# Patient Record
Sex: Female | Born: 1970 | Hispanic: No | Marital: Married | Smoking: Never smoker
Health system: Southern US, Community
[De-identification: ages and names within clinical notes are randomized; demographics above are authoritative.]

## PROBLEM LIST (undated history)

## (undated) DIAGNOSIS — E7800 Pure hypercholesterolemia, unspecified: Secondary | ICD-10-CM

## (undated) DIAGNOSIS — K219 Gastro-esophageal reflux disease without esophagitis: Secondary | ICD-10-CM

## (undated) DIAGNOSIS — E079 Disorder of thyroid, unspecified: Secondary | ICD-10-CM

## (undated) HISTORY — DX: Gastro-esophageal reflux disease without esophagitis: K21.9

## (undated) HISTORY — PX: TONSILLECTOMY AND ADENOIDECTOMY: SUR1326

## (undated) HISTORY — PX: SINUS IRRIGATION: SHX2411

## (undated) HISTORY — DX: Disorder of thyroid, unspecified: E07.9

## (undated) HISTORY — DX: Pure hypercholesterolemia, unspecified: E78.00

## (undated) HISTORY — PX: COCHLEAR IMPLANT: SUR684

---

## 2009-05-23 ENCOUNTER — Ambulatory Visit: Payer: Self-pay | Admitting: Family Medicine

## 2010-02-24 ENCOUNTER — Emergency Department: Payer: Self-pay | Admitting: Emergency Medicine

## 2012-06-04 ENCOUNTER — Emergency Department: Payer: Self-pay | Admitting: *Deleted

## 2012-06-04 LAB — CBC WITH DIFFERENTIAL/PLATELET
Basophil #: 0.1 10*3/uL (ref 0.0–0.1)
Eosinophil #: 0.3 10*3/uL (ref 0.0–0.7)
Eosinophil %: 2.5 %
HGB: 14.4 g/dL (ref 12.0–16.0)
MCH: 31.2 pg (ref 26.0–34.0)
MCV: 89 fL (ref 80–100)
Monocyte #: 0.8 x10 3/mm (ref 0.2–0.9)
Monocyte %: 7.2 %
Neutrophil #: 4.8 10*3/uL (ref 1.4–6.5)
Neutrophil %: 46 %
RBC: 4.64 10*6/uL (ref 3.80–5.20)
WBC: 10.4 10*3/uL (ref 3.6–11.0)

## 2012-06-04 LAB — URINALYSIS, COMPLETE
Bilirubin,UR: NEGATIVE
Blood: NEGATIVE
Hyaline Cast: 1
Leukocyte Esterase: NEGATIVE
Ph: 6 (ref 4.5–8.0)
Protein: NEGATIVE
RBC,UR: <1 /HPF (ref 0–5)

## 2012-06-04 LAB — BASIC METABOLIC PANEL
Chloride: 98 mmol/L (ref 98–107)
Co2: 34 mmol/L — ABNORMAL HIGH (ref 21–32)
Creatinine: 0.88 mg/dL (ref 0.60–1.30)
EGFR (African American): >60
Osmolality: 277 (ref 275–301)
Sodium: 139 mmol/L (ref 136–145)

## 2012-07-15 ENCOUNTER — Emergency Department: Payer: Self-pay | Admitting: Emergency Medicine

## 2015-10-03 ENCOUNTER — Ambulatory Visit: Payer: Self-pay | Admitting: Internal Medicine

## 2015-10-03 DIAGNOSIS — K219 Gastro-esophageal reflux disease without esophagitis: Secondary | ICD-10-CM | POA: Insufficient documentation

## 2015-10-03 DIAGNOSIS — I10 Essential (primary) hypertension: Secondary | ICD-10-CM | POA: Insufficient documentation

## 2015-10-03 DIAGNOSIS — E785 Hyperlipidemia, unspecified: Secondary | ICD-10-CM | POA: Insufficient documentation

## 2015-10-03 DIAGNOSIS — E039 Hypothyroidism, unspecified: Secondary | ICD-10-CM | POA: Insufficient documentation

## 2015-10-03 DIAGNOSIS — G969 Disorder of central nervous system, unspecified: Secondary | ICD-10-CM | POA: Insufficient documentation

## 2015-12-05 ENCOUNTER — Ambulatory Visit: Payer: Self-pay

## 2015-12-12 ENCOUNTER — Ambulatory Visit: Payer: Self-pay | Admitting: Internal Medicine

## 2016-01-15 DIAGNOSIS — G969 Disorder of central nervous system, unspecified: Secondary | ICD-10-CM

## 2016-01-15 DIAGNOSIS — I10 Essential (primary) hypertension: Secondary | ICD-10-CM

## 2016-01-15 DIAGNOSIS — E785 Hyperlipidemia, unspecified: Secondary | ICD-10-CM

## 2016-01-15 DIAGNOSIS — E039 Hypothyroidism, unspecified: Secondary | ICD-10-CM

## 2016-01-15 DIAGNOSIS — K219 Gastro-esophageal reflux disease without esophagitis: Secondary | ICD-10-CM

## 2016-01-16 ENCOUNTER — Ambulatory Visit: Payer: Self-pay | Admitting: Ophthalmology

## 2016-02-06 ENCOUNTER — Ambulatory Visit: Payer: Self-pay | Admitting: Internal Medicine

## 2016-02-06 ENCOUNTER — Encounter: Payer: Self-pay | Admitting: Internal Medicine

## 2016-02-06 VITALS — BP 116/80 | HR 81 | Temp 99.1°F | Wt 218.0 lb

## 2016-02-06 DIAGNOSIS — M25519 Pain in unspecified shoulder: Secondary | ICD-10-CM

## 2016-02-06 DIAGNOSIS — I10 Essential (primary) hypertension: Secondary | ICD-10-CM

## 2016-02-06 DIAGNOSIS — E039 Hypothyroidism, unspecified: Secondary | ICD-10-CM

## 2016-02-06 NOTE — Progress Notes (Signed)
   Subjective:    Patient ID: Jamie Whitaker, female    DOB: 11/10/1971, 45 y.o.   MRN: [id number]  HPI  Patient Active Problem List   Diagnosis Date Noted  . GERD (gastroesophageal reflux disease) 10/03/2015  . Hyperlipidemia 10/03/2015  . Hypothyroidism 10/03/2015  . Hypertension 10/03/2015  . 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 Pt complains of shoulder pain. Pt was seen at [facility] but they said it was Chronic pain.    Review of Systems  Musculoskeletal: Positive for joint swelling.       Objective:   Physical Exam  Constitutional: She is oriented to person, place, and time.  Cardiovascular: Normal rate, regular rhythm and normal heart sounds.   Pulmonary/Chest: Effort normal and breath sounds normal.  Neurological: She is alert and oriented to person, place, and time.    BP 116/80 mmHg  Pulse 81  Temp(Src) 99.1 F (37.3 C)  Wt 218 lb (98.884 kg)    Medication List       This list is accurate as of: 02/06/16 10:35 AM.  Always use your most recent med list.               atorvastatin 20 MG tablet  Commonly known as:  LIPITOR  Take 20 mg by mouth daily.     citalopram 20 MG tablet  Commonly known as:  CELEXA  Take 20 mg by mouth daily.     gabapentin 300 MG capsule  Commonly known as:  NEURONTIN  Take 300 mg by mouth 3 (three) times daily.     levothyroxine 25 MCG tablet  Commonly known as:  SYNTHROID, LEVOTHROID  Take 25 mcg by mouth daily before breakfast.     loratadine 10 MG tablet  Commonly known as:  CLARITIN  Take 10 mg by mouth daily.     naproxen 500 MG tablet  Commonly known as:  NAPROSYN  Take 500 mg by mouth 2 (two) times daily with a meal.     omeprazole 20 MG capsule  Commonly known as:  PRILOSEC  Take 20 mg by mouth daily.     triamterene-hydrochlorothiazide 75-50 MG tablet  Commonly known as:  MAXZIDE  Take 1 tablet by mouth daily.            Assessment & Plan:   Shoulder pain Pt is on Naproxen. Does not appear to be an inflammatory form of arthritis but will check labs.   Hypertension Well-controlled.    Labs today, Met c, cbc, lipid, tsa, vitamin d, UA Md f/u: 6 months Labs the week before.

## 2016-02-06 NOTE — Assessment & Plan Note (Signed)
Well controlled 

## 2016-02-06 NOTE — Assessment & Plan Note (Addendum)
Pt is on Naproxen. Does not appear to be an inflammatory form of arthritis but will check labs. Pt advised to not overstrain the shoulder and rest it.

## 2016-02-07 LAB — COMPREHENSIVE METABOLIC PANEL
A/G RATIO: 1.7 (ref 1.2–2.2)
ALBUMIN: 4.3 g/dL (ref 3.5–5.5)
ALT: 16 IU/L (ref 0–32)
AST: 39 IU/L (ref 0–40)
Alkaline Phosphatase: 71 IU/L (ref 39–117)
BUN / CREAT RATIO: 18 (ref 9–23)
BUN: 14 mg/dL (ref 6–24)
Bilirubin Total: 0.3 mg/dL (ref 0.0–1.2)
CALCIUM: 9 mg/dL (ref 8.7–10.2)
CO2: 26 mmol/L (ref 18–29)
CREATININE: 0.78 mg/dL (ref 0.57–1.00)
Chloride: 102 mmol/L (ref 96–106)
GFR, EST AFRICAN AMERICAN: 108 mL/min/{1.73_m2} (ref >59)
GFR, EST NON AFRICAN AMERICAN: 94 mL/min/{1.73_m2} (ref >59)
GLOBULIN, TOTAL: 2.6 g/dL (ref 1.5–4.5)
Glucose: 102 mg/dL — ABNORMAL HIGH (ref 65–99)
POTASSIUM: 3.4 mmol/L — AB (ref 3.5–5.2)
SODIUM: 142 mmol/L (ref 134–144)
TOTAL PROTEIN: 6.9 g/dL (ref 6.0–8.5)

## 2016-02-07 LAB — CBC WITH DIFFERENTIAL/PLATELET
BASOS: 0 %
Basophils Absolute: 0 10*3/uL (ref 0.0–0.2)
EOS (ABSOLUTE): 0.3 10*3/uL (ref 0.0–0.4)
EOS: 4 %
HEMATOCRIT: 38.3 % (ref 34.0–46.6)
Hemoglobin: 13.1 g/dL (ref 11.1–15.9)
IMMATURE GRANULOCYTES: 0 %
Immature Grans (Abs): 0 10*3/uL (ref 0.0–0.1)
Lymphocytes Absolute: 2.5 10*3/uL (ref 0.7–3.1)
Lymphs: 34 %
MCH: 29.8 pg (ref 26.6–33.0)
MCHC: 34.2 g/dL (ref 31.5–35.7)
MCV: 87 fL (ref 79–97)
MONOS ABS: 0.5 10*3/uL (ref 0.1–0.9)
Monocytes: 6 %
NEUTROS ABS: 4.1 10*3/uL (ref 1.4–7.0)
NEUTROS PCT: 56 %
PLATELETS: 408 10*3/uL — AB (ref 150–379)
RBC: 4.4 x10E6/uL (ref 3.77–5.28)
RDW: 14.1 % (ref 12.3–15.4)
WBC: 7.4 10*3/uL (ref 3.4–10.8)

## 2016-02-07 LAB — URINALYSIS
BILIRUBIN UA: NEGATIVE
GLUCOSE, UA: NEGATIVE
KETONES UA: NEGATIVE
Leukocytes, UA: NEGATIVE
Nitrite, UA: NEGATIVE
PROTEIN UA: NEGATIVE
RBC, UA: NEGATIVE
Specific Gravity, UA: 1.024 (ref 1.005–1.030)
Urobilinogen, Ur: 0.2 mg/dL (ref 0.2–1.0)
pH, UA: 6 (ref 5.0–7.5)

## 2016-02-07 LAB — TSH: TSH: 2.68 u[IU]/mL (ref 0.450–4.500)

## 2016-02-07 LAB — LIPID PANEL
CHOL/HDL RATIO: 4.2 ratio (ref 0.0–4.4)
Cholesterol, Total: 157 mg/dL (ref 100–199)
HDL: 37 mg/dL — AB (ref >39)
LDL Calculated: 102 mg/dL — ABNORMAL HIGH (ref 0–99)
Triglycerides: 90 mg/dL (ref 0–149)
VLDL CHOLESTEROL CAL: 18 mg/dL (ref 5–40)

## 2016-02-07 LAB — VITAMIN D 25 HYDROXY (VIT D DEFICIENCY, FRACTURES): VIT D 25 HYDROXY: 30.2 ng/mL (ref 30.0–100.0)

## 2016-02-13 NOTE — Progress Notes (Signed)
Labs OK let Pt Know   No Rx changes needed

## 2016-02-19 ENCOUNTER — Telehealth: Payer: Self-pay

## 2016-02-19 NOTE — Telephone Encounter (Signed)
Pt called wants a voucher for eye glasses  [phone]

## 2016-02-21 NOTE — Telephone Encounter (Signed)
Pt left message and is waiting for another return call.

## 2016-02-21 NOTE — Telephone Encounter (Signed)
Returned pt's call lm [phone]

## 2016-08-06 ENCOUNTER — Other Ambulatory Visit: Payer: Self-pay

## 2016-08-06 DIAGNOSIS — E785 Hyperlipidemia, unspecified: Secondary | ICD-10-CM

## 2016-08-08 LAB — LIPID PANEL
Chol/HDL Ratio: 3.8 ratio units (ref 0.0–4.4)
Cholesterol, Total: 172 mg/dL (ref 100–199)
HDL: 45 mg/dL (ref >39)
LDL Calculated: 105 mg/dL — ABNORMAL HIGH (ref 0–99)
Triglycerides: 110 mg/dL (ref 0–149)
VLDL CHOLESTEROL CAL: 22 mg/dL (ref 5–40)

## 2016-08-08 LAB — CBC WITH DIFFERENTIAL

## 2016-08-08 LAB — URINALYSIS

## 2016-08-08 LAB — TSH: TSH: 2.23 u[IU]/mL (ref 0.450–4.500)

## 2016-08-08 LAB — VITAMIN D 25 HYDROXY (VIT D DEFICIENCY, FRACTURES): Vit D, 25-Hydroxy: 29.4 ng/mL — ABNORMAL LOW (ref 30.0–100.0)

## 2016-08-13 ENCOUNTER — Ambulatory Visit: Payer: Self-pay | Admitting: Internal Medicine

## 2016-08-14 ENCOUNTER — Ambulatory Visit: Payer: Self-pay | Admitting: Urology

## 2016-08-14 VITALS — BP 137/90 | HR 93 | Ht 61.0 in | Wt 226.0 lb

## 2016-08-14 DIAGNOSIS — R7989 Other specified abnormal findings of blood chemistry: Secondary | ICD-10-CM

## 2016-08-14 DIAGNOSIS — J00 Acute nasopharyngitis [common cold]: Secondary | ICD-10-CM

## 2016-08-14 DIAGNOSIS — K219 Gastro-esophageal reflux disease without esophagitis: Secondary | ICD-10-CM

## 2016-08-14 MED ORDER — AMOXICILLIN-POT CLAVULANATE 875-125 MG PO TABS
1.0000 | ORAL_TABLET | Freq: Two times a day (BID) | ORAL | 0 refills | Status: DC
Start: 2016-08-14 — End: 2019-11-07

## 2016-08-14 NOTE — Progress Notes (Signed)
  Patient: Jamie LoftyDanielle Q Leccese Female    DOB: [birth year]   45 y.o.   MRN: [id number] Visit Date: 08/14/2016  Today's Provider: [facility]   Chief Complaint  Patient presents with  . Follow-up    labwork  . URI    1 wk hx of coughing up yellow sputum, and sinus congestion, with chest pain (pain is with breathing, worse with coughing)    Subjective:    HPI Patient is a 45 year old AAF who presents for labs results and symptoms of an URI for 1 week.    Vitamin D level is 29.    Obesity- patient is eating fried foods and does not want to change her eating habits  Congestion is really bad in the morning  Patient has is antagonist at today's visit.  She is wondering if I can make the right diagnosis.    She has been using OTC for the last week.  Already taking allergy medications.  Wanting an antibiotic.    She feels she needs to stop going to doctors because they tell her the same thing.   No Known Allergies Previous Medications   ATORVASTATIN (LIPITOR) 20 MG TABLET    Take 20 mg by mouth daily.   CITALOPRAM (CELEXA) 20 MG TABLET    Take 20 mg by mouth daily.   FEXOFENADINE (ALLEGRA) 180 MG TABLET    Take 180 mg by mouth daily.   GABAPENTIN (NEURONTIN) 300 MG CAPSULE    Take 300 mg by mouth 3 (three) times daily.   LEVOTHYROXINE (SYNTHROID, LEVOTHROID) 25 MCG TABLET    Take 25 mcg by mouth daily before breakfast.   LORATADINE (CLARITIN) 10 MG TABLET    Take 10 mg by mouth daily.   NAPROXEN (NAPROSYN) 500 MG TABLET    Take 500 mg by mouth 2 (two) times daily with a meal.   OMEPRAZOLE (PRILOSEC) 20 MG CAPSULE    Take 20 mg by mouth daily.   TRIAMTERENE-HYDROCHLOROTHIAZIDE (MAXZIDE) 75-50 MG TABLET    Take 1 tablet by mouth daily.    Review of Systems  Social History  Substance Use Topics  . Smoking status: Never Smoker  . Smokeless tobacco: Never Used  . Alcohol use Yes     Comment: Moonshine 3 shots per day   Objective:   BP 137/90   Pulse 93   Ht 5\' 1"  (1.549  m)   Wt 226 lb (102.5 kg)   LMP  (Approximate) Comment: Had 2 during Sept 2017  BMI 42.70 kg/m   Physical Exam Constitutional: Well nourished. Alert and oriented, No acute distress. HEENT: [place] AT, moist mucus membranes. Trachea midline, no masses. Cardiovascular: No clubbing, cyanosis, or edema. Respiratory: Normal respiratory effort, no increased work of breathing. Skin: No rashes, bruises or suspicious lesions. Lymph: No cervical or inguinal adenopathy. Neurologic: Grossly intact, no focal deficits, moving all 4 extremities. Psychiatric: Normal mood and affect.      Assessment & Plan:  1. URI  - start Augmentin 875/125, one twice daily for ten days   2. GERD  - advised to lose weight  3. Low vitamin D  - start Vitamin D3 5000 IU  [facility]   [facility]

## 2016-12-25 ENCOUNTER — Ambulatory Visit: Payer: Self-pay

## 2017-08-04 ENCOUNTER — Telehealth: Payer: Self-pay | Admitting: Nurse Practitioner

## 2017-08-04 NOTE — Telephone Encounter (Signed)
Wants to make an eye appt. Also asked about flu shots

## 2017-08-11 ENCOUNTER — Telehealth: Payer: Self-pay | Admitting: Adult Health Nurse Practitioner

## 2017-08-11 NOTE — Telephone Encounter (Signed)
Wanted to know date of free flu shot. Called back

## 2019-11-07 ENCOUNTER — Ambulatory Visit
Admission: EM | Admit: 2019-11-07 | Discharge: 2019-11-07 | Disposition: A | Discharge status: Home / Self Care | Payer: HRSA Program | Attending: Internal Medicine | Admitting: Internal Medicine

## 2019-11-07 ENCOUNTER — Other Ambulatory Visit: Payer: Self-pay

## 2019-11-07 DIAGNOSIS — Z79890 Hormone replacement therapy (postmenopausal): Secondary | ICD-10-CM | POA: Diagnosis not present

## 2019-11-07 DIAGNOSIS — Z9621 Cochlear implant status: Secondary | ICD-10-CM | POA: Insufficient documentation

## 2019-11-07 DIAGNOSIS — J0100 Acute maxillary sinusitis, unspecified: Secondary | ICD-10-CM | POA: Diagnosis not present

## 2019-11-07 DIAGNOSIS — Z79899 Other long term (current) drug therapy: Secondary | ICD-10-CM | POA: Diagnosis not present

## 2019-11-07 DIAGNOSIS — Z20828 Contact with and (suspected) exposure to other viral communicable diseases: Secondary | ICD-10-CM | POA: Insufficient documentation

## 2019-11-07 DIAGNOSIS — Z841 Family history of disorders of kidney and ureter: Secondary | ICD-10-CM | POA: Diagnosis not present

## 2019-11-07 DIAGNOSIS — E785 Hyperlipidemia, unspecified: Secondary | ICD-10-CM | POA: Diagnosis not present

## 2019-11-07 DIAGNOSIS — E039 Hypothyroidism, unspecified: Secondary | ICD-10-CM | POA: Diagnosis not present

## 2019-11-07 DIAGNOSIS — K219 Gastro-esophageal reflux disease without esophagitis: Secondary | ICD-10-CM | POA: Insufficient documentation

## 2019-11-07 DIAGNOSIS — I10 Essential (primary) hypertension: Secondary | ICD-10-CM | POA: Insufficient documentation

## 2019-11-07 MED ORDER — AMOXICILLIN 500 MG PO CAPS: 500.0000 mg | ORAL_CAPSULE | Freq: Three times a day (TID) | ORAL | 0 refills | Status: DC

## 2019-11-07 MED ORDER — FLUTICASONE PROPIONATE 50 MCG/ACT NA SUSP: 1.0000 | Freq: Every day | NASAL | 0 refills | Status: AC

## 2019-11-07 NOTE — ED Provider Notes (Signed)
[facility]    CSN: [id number] Arrival date & time: 11/07/19  1634      History   Chief Complaint Chief Complaint  Patient presents with  . Sinusitis    HPI Jamie Whitaker is a 48 y.o. female with a history of hypercholesterolemia comes to urgent care with complaints of nasal congestion of 1 week duration.  Symptoms started insidiously and is gotten progressively worse.  It is associated with clear nasal discharge.   Patient also complains of pressure over the sinuses.  No fever or chills.  Patient denies postnasal drip no nausea or vomiting.  No shortness of breath.  No chest pain or chest pressure.  HPI  Past Medical History:  Diagnosis Date  . GERD (gastroesophageal reflux disease)   . High cholesterol   . Thyroid disease     Patient Active Problem List   Diagnosis Date Noted  . Shoulder pain 02/06/2016  . GERD (gastroesophageal reflux disease) 10/03/2015  . Hyperlipidemia 10/03/2015  . Hypothyroidism 10/03/2015  . Hypertension 10/03/2015  . 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 COCHLEAR IMPLANT      OB History   No obstetric history on file.      Home Medications    Prior to Admission medications   Medication Sig Start Date End Date Taking? Authorizing Provider  atorvastatin (LIPITOR) 20 MG tablet Take 20 mg by mouth daily.   Yes [provider]  fexofenadine (ALLEGRA) 180 MG tablet Take 180 mg by mouth daily.   Yes [provider]  gabapentin (NEURONTIN) 300 MG capsule Take 300 mg by mouth 3 (three) times daily.   Yes [provider]  levothyroxine (SYNTHROID, LEVOTHROID) 25 MCG tablet Take 25 mcg by mouth daily before breakfast.   Yes [provider]  naproxen (NAPROSYN) 500 MG tablet Take 500 mg by mouth 2 (two) times daily with a meal.   Yes [provider]  omeprazole (PRILOSEC) 20 MG capsule Take 20 mg by mouth daily.   Yes  [provider]  triamterene-hydrochlorothiazide (MAXZIDE) 75-50 MG tablet Take 1 tablet by mouth daily.   Yes [provider]  Vitamin D, Ergocalciferol, (DRISDOL) 1.25 MG (50000 UT) CAPS capsule Take 50,000 Units by mouth once a week. 10/11/19  Yes [provider]  citalopram (CELEXA) 20 MG tablet Take 20 mg by mouth daily.  11/07/19 Yes [provider]  amoxicillin (AMOXIL) 500 MG capsule Take 1 capsule (500 mg total) by mouth 3 (three) times daily. 11/07/19   Chase Picket, MD  fluticasone (FLONASE) 50 MCG/ACT nasal spray Place 1 spray into both nostrils daily. 11/07/19   Chase Picket, MD  loratadine (CLARITIN) 10 MG tablet Take 10 mg by mouth daily.  11/07/19  [provider]    Family History Family History  Problem Relation Age of Onset  . Renal Disease Mother     Social History Social History   Tobacco Use  . Smoking status: Never Smoker  . Smokeless tobacco: Never Used  Substance Use Topics  . Alcohol use: Yes    Comment: Moonshine 3 shots per day  . Drug use: Yes    Types: Marijuana    Comment: 2 blunts per month      Allergies   Patient has no known allergies.   Review of Systems Review of Systems  Constitutional: Negative for activity change, chills, fatigue and fever.  HENT: Positive for sinus pressure  and sinus pain. Negative for congestion, facial swelling and sore throat.   Respiratory: Negative for cough, shortness of breath and wheezing.   Gastrointestinal: Negative for nausea and vomiting.  Musculoskeletal: Negative.   Skin: Negative.   Neurological: Negative for dizziness, weakness and headaches.     Physical Exam Triage Vital Signs ED Triage Vitals  Enc Vitals Group     BP 11/07/19 1705 (!) 149/94     Pulse Rate 11/07/19 1705 93     Resp 11/07/19 1705 14     Temp 11/07/19 1705 99.8 F (37.7 C)     Temp Source 11/07/19 1705 Oral     SpO2 11/07/19 1705 100 %     Weight 11/07/19 1701 235 lb  (106.6 kg)     Height 11/07/19 1701 5\' 1"  (1.549 m)     Head Circumference --      Peak Flow --      Pain Score 11/07/19 1701 8     Pain Loc --      Pain Edu? --      Excl. in GC? --    No data found.  Updated Vital Signs BP (!) 149/94 (BP Location: Left Arm)   Pulse 93   Temp 99.8 F (37.7 C) (Oral)   Resp 14   Ht 5\' 1"  (1.549 m)   Wt 106.6 kg   LMP 10/14/2019   SpO2 100%   BMI 44.40 kg/m   Visual Acuity Right Eye Distance:   Left Eye Distance:   Bilateral Distance:    Right Eye Near:   Left Eye Near:    Bilateral Near:     Physical Exam Vitals and nursing note reviewed.  Constitutional:      General: She is not in acute distress.    Appearance: Normal appearance. She is not ill-appearing.  HENT:     Right Ear: Tympanic membrane normal. There is no impacted cerumen.     Left Ear: Tympanic membrane normal. There is no impacted cerumen.     Nose: Congestion present.     Mouth/Throat:     Mouth: Mucous membranes are moist.     Pharynx: No oropharyngeal exudate or posterior oropharyngeal erythema.  Cardiovascular:     Rate and Rhythm: Normal rate and regular rhythm.     Pulses: Normal pulses.     Heart sounds: Normal heart sounds.  Pulmonary:     Effort: Pulmonary effort is normal.     Breath sounds: Normal breath sounds.  Skin:    Capillary Refill: Capillary refill takes less than 2 seconds.  Neurological:     Mental Status: She is alert.      UC Treatments / Results  Labs (all labs ordered are listed, but only abnormal results are displayed) Labs Reviewed  NOVEL CORONAVIRUS, NAA (HOSP ORDER, SEND-OUT TO REF LAB; TAT 18-24 HRS)    EKG   Radiology No results found.  Procedures Procedures (including critical care time)  Medications Ordered in UC Medications - No data to display  Initial Impression / Assessment and Plan / UC Course  I have reviewed the triage vital signs and the nursing notes.  Pertinent labs & imaging results that were  available during my care of the patient were reviewed by me and considered in my medical decision making (see chart for details).     1.  Acute nonrecurrent maxillary sinusitis: Continue over-the-counter allergy medications Flonase 1 spray each nostril daily A prescription for amoxicillin was written with clear instructions with  patient to fill the prescription if she does not see any improvement in 3 to 4 days.  Has symptoms started 7 days ago and most likely was viral at this time am not sure if she is developing superimposed bacterial sinusitis or not.  Patient verbalized understanding. Final Clinical Impressions(s) / UC Diagnoses   Final diagnoses:  Acute non-recurrent maxillary sinusitis   Discharge Instructions   None    ED Prescriptions    Medication Sig Dispense Auth. Provider   fluticasone (FLONASE) 50 MCG/ACT nasal spray Place 1 spray into both nostrils daily. 16 g Pax Reasoner, Britta MccreedyPhilip O, MD   amoxicillin (AMOXIL) 500 MG capsule Take 1 capsule (500 mg total) by mouth 3 (three) times daily. 21 capsule Anja Neuzil, Britta MccreedyPhilip O, MD     PDMP not reviewed this encounter.   Merrilee JanskyLamptey, Aza Dantes O, MD 11/08/19 1329

## 2019-11-07 NOTE — ED Triage Notes (Signed)
Patient states that she has sinus like congestion with post nasal congestion x 1 week. Patient states that symptoms have been constant and she states that she is not concerned for Covid.

## 2019-11-08 LAB — NOVEL CORONAVIRUS, NAA (HOSP ORDER, SEND-OUT TO REF LAB; TAT 18-24 HRS): SARS-CoV-2, NAA: NOT DETECTED

## 2020-02-24 ENCOUNTER — Other Ambulatory Visit: Payer: Self-pay

## 2020-02-24 ENCOUNTER — Encounter: Payer: Self-pay | Admitting: Emergency Medicine

## 2020-02-24 ENCOUNTER — Ambulatory Visit
Admission: EM | Admit: 2020-02-24 | Discharge: 2020-02-24 | Disposition: A | Discharge status: Home / Self Care | Payer: HRSA Program | Attending: Family Medicine | Admitting: Family Medicine

## 2020-02-24 DIAGNOSIS — I10 Essential (primary) hypertension: Secondary | ICD-10-CM | POA: Insufficient documentation

## 2020-02-24 DIAGNOSIS — H6503 Acute serous otitis media, bilateral: Secondary | ICD-10-CM

## 2020-02-24 DIAGNOSIS — Z20822 Contact with and (suspected) exposure to covid-19: Secondary | ICD-10-CM | POA: Diagnosis not present

## 2020-02-24 DIAGNOSIS — J0100 Acute maxillary sinusitis, unspecified: Secondary | ICD-10-CM

## 2020-02-24 DIAGNOSIS — R519 Headache, unspecified: Secondary | ICD-10-CM | POA: Diagnosis present

## 2020-02-24 DIAGNOSIS — Z79899 Other long term (current) drug therapy: Secondary | ICD-10-CM | POA: Insufficient documentation

## 2020-02-24 DIAGNOSIS — Z841 Family history of disorders of kidney and ureter: Secondary | ICD-10-CM | POA: Diagnosis not present

## 2020-02-24 DIAGNOSIS — E785 Hyperlipidemia, unspecified: Secondary | ICD-10-CM | POA: Diagnosis not present

## 2020-02-24 DIAGNOSIS — E039 Hypothyroidism, unspecified: Secondary | ICD-10-CM | POA: Insufficient documentation

## 2020-02-24 DIAGNOSIS — Z9621 Cochlear implant status: Secondary | ICD-10-CM | POA: Insufficient documentation

## 2020-02-24 DIAGNOSIS — K219 Gastro-esophageal reflux disease without esophagitis: Secondary | ICD-10-CM | POA: Diagnosis not present

## 2020-02-24 DIAGNOSIS — Z79890 Hormone replacement therapy (postmenopausal): Secondary | ICD-10-CM | POA: Diagnosis not present

## 2020-02-24 MED ORDER — AMOXICILLIN 875 MG PO TABS: 875.0000 mg | ORAL_TABLET | Freq: Two times a day (BID) | ORAL | 0 refills | Status: DC

## 2020-02-24 NOTE — ED Triage Notes (Signed)
Patient c/o sinus pressure and congestion and headaches for a week.  Patient denies fevers.

## 2020-02-24 NOTE — ED Provider Notes (Signed)
[facility]    CSN: [id number] Arrival date & time: 02/24/20  1450      History   Chief Complaint Chief Complaint  Patient presents with  . Sinus Problem  . Headache    HPI Jamie Whitaker is a 49 y.o. female.   49 yo female with a c/o sinus congestion, sinus pressure and sinus headaches for the past week. Also reports ear pressure. Denies any fevers, chills, shortness of breath, cough.     Past Medical History:  Diagnosis Date  . GERD (gastroesophageal reflux disease)   . High cholesterol   . Thyroid disease     Patient Active Problem List   Diagnosis Date Noted  . Shoulder pain 02/06/2016  . GERD (gastroesophageal reflux disease) 10/03/2015  . Hyperlipidemia 10/03/2015  . Hypothyroidism 10/03/2015  . Hypertension 10/03/2015  . 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 COCHLEAR IMPLANT      OB History   No obstetric history on file.      Home Medications    Prior to Admission medications   Medication Sig Start Date End Date Taking? Authorizing Provider  atorvastatin (LIPITOR) 20 MG tablet Take 20 mg by mouth daily.   Yes [provider]  fexofenadine (ALLEGRA) 180 MG tablet Take 180 mg by mouth daily.   Yes [provider]  fluticasone (FLONASE) 50 MCG/ACT nasal spray Place 1 spray into both nostrils daily. 11/07/19  Yes Lamptey, Myrene Galas, MD  gabapentin (NEURONTIN) 300 MG capsule Take 300 mg by mouth 3 (three) times daily.   Yes [provider]  levothyroxine (SYNTHROID, LEVOTHROID) 25 MCG tablet Take 25 mcg by mouth daily before breakfast.   Yes [provider]  omeprazole (PRILOSEC) 20 MG capsule Take 20 mg by mouth daily.   Yes [provider]  triamterene-hydrochlorothiazide (MAXZIDE) 75-50 MG tablet Take 1 tablet by mouth daily.   Yes [provider]  Vitamin D, Ergocalciferol, (DRISDOL) 1.25 MG (50000 UT) CAPS capsule Take  50,000 Units by mouth once a week. 10/11/19  Yes [provider]  amoxicillin (AMOXIL) 875 MG tablet Take 1 tablet (875 mg total) by mouth 2 (two) times daily. 02/24/20   Norval Gable, MD  naproxen (NAPROSYN) 500 MG tablet Take 500 mg by mouth 2 (two) times daily with a meal.    [provider]  citalopram (CELEXA) 20 MG tablet Take 20 mg by mouth daily.  11/07/19  [provider]  loratadine (CLARITIN) 10 MG tablet Take 10 mg by mouth daily.  11/07/19  [provider]    Family History Family History  Problem Relation Age of Onset  . Renal Disease Mother     Social History Social History   Tobacco Use  . Smoking status: Never Smoker  . Smokeless tobacco: Never Used  Substance Use Topics  . Alcohol use: Yes    Comment: Moonshine 3 shots per day  . Drug use: Yes    Types: Marijuana    Comment: 2 blunts per month      Allergies   Patient has no known allergies.   Review of Systems Review of Systems   Physical Exam Triage Vital Signs ED Triage Vitals  Enc Vitals Group     BP 02/24/20 1501 (!) 137/98     Pulse Rate 02/24/20 1501 88     Resp 02/24/20 1501 16     Temp 02/24/20 1501 99.2 F (37.3 C)  Temp Source 02/24/20 1501 Oral     SpO2 02/24/20 1501 99 %     Weight 02/24/20 1459 233 lb (105.7 kg)     Height 02/24/20 1459 5\' 1"  (1.549 m)     Head Circumference --      Peak Flow --      Pain Score 02/24/20 1459 7     Pain Loc --      Pain Edu? --      Excl. in GC? --    No data found.  Updated Vital Signs BP (!) 137/98 (BP Location: Left Arm)   Pulse 88   Temp 99.2 F (37.3 C) (Oral)   Resp 16   Ht 5\' 1"  (1.549 m)   Wt 105.7 kg   LMP 01/27/2020 (Approximate)   SpO2 99%   BMI 44.02 kg/m   Visual Acuity Right Eye Distance:   Left Eye Distance:   Bilateral Distance:    Right Eye Near:   Left Eye Near:    Bilateral Near:     Physical Exam Vitals and nursing note reviewed.  Constitutional:      General:  She is not in acute distress.    Appearance: She is not toxic-appearing or diaphoretic.  HENT:     Right Ear: Tympanic membrane is erythematous and bulging.     Left Ear: Tympanic membrane is erythematous and bulging.     Nose:     Right Sinus: Maxillary sinus tenderness present.     Left Sinus: Maxillary sinus tenderness present.  Cardiovascular:     Rate and Rhythm: Normal rate.     Heart sounds: Normal heart sounds.  Pulmonary:     Effort: Pulmonary effort is normal.     Breath sounds: Normal breath sounds.  Neurological:     Mental Status: She is alert.      UC Treatments / Results  Labs (all labs ordered are listed, but only abnormal results are displayed) Labs Reviewed  SARS CORONAVIRUS 2 (TAT 6-24 HRS)    EKG   Radiology No results found.  Procedures Procedures (including critical care time)  Medications Ordered in UC Medications - No data to display  Initial Impression / Assessment and Plan / UC Course  I have reviewed the triage vital signs and the nursing notes.  Pertinent labs & imaging results that were available during my care of the patient were reviewed by me and considered in my medical decision making (see chart for details).      Final Clinical Impressions(s) / UC Diagnoses   Final diagnoses:  Acute maxillary sinusitis, recurrence not specified  Bilateral acute serous otitis media, recurrence not specified    ED Prescriptions    Medication Sig Dispense Auth. Provider   amoxicillin (AMOXIL) 875 MG tablet Take 1 tablet (875 mg total) by mouth 2 (two) times daily. 20 tablet , MD     1. diagnosis reviewed with patient 2. rx as per orders above; reviewed possible side effects, interactions, risks and benefits  3. Recommend supportive treatment with otc analgesics prn 4. Follow-up prn if symptoms worsen or don't improve   PDMP not reviewed this encounter.   01/29/2020, MD 02/24/20 1940

## 2020-02-25 LAB — SARS CORONAVIRUS 2 (TAT 6-24 HRS): SARS Coronavirus 2: NEGATIVE

## 2020-09-27 ENCOUNTER — Ambulatory Visit
Admission: EM | Admit: 2020-09-27 | Discharge: 2020-09-27 | Disposition: A | Discharge status: Home / Self Care | Payer: Self-pay | Attending: Physician Assistant | Admitting: Physician Assistant

## 2020-09-27 ENCOUNTER — Telehealth: Payer: Self-pay | Admitting: Emergency Medicine

## 2020-09-27 ENCOUNTER — Other Ambulatory Visit: Payer: Self-pay

## 2020-09-27 DIAGNOSIS — Z20822 Contact with and (suspected) exposure to covid-19: Secondary | ICD-10-CM | POA: Insufficient documentation

## 2020-09-27 DIAGNOSIS — R0981 Nasal congestion: Secondary | ICD-10-CM | POA: Insufficient documentation

## 2020-09-27 DIAGNOSIS — R059 Cough, unspecified: Secondary | ICD-10-CM | POA: Insufficient documentation

## 2020-09-27 DIAGNOSIS — J069 Acute upper respiratory infection, unspecified: Secondary | ICD-10-CM | POA: Insufficient documentation

## 2020-09-27 LAB — RESP PANEL BY RT-PCR (RSV, FLU A&B, COVID)  RVPGX2
Influenza A by PCR: NEGATIVE
Influenza B by PCR: NEGATIVE
Resp Syncytial Virus by PCR: NEGATIVE
SARS Coronavirus 2 by RT PCR: NEGATIVE

## 2020-09-27 MED ORDER — BENZONATATE 100 MG PO CAPS: 200.0000 mg | ORAL_CAPSULE | Freq: Three times a day (TID) | ORAL | 0 refills | Status: AC | PRN

## 2020-09-27 MED ORDER — IPRATROPIUM BROMIDE 0.06 % NA SOLN: 2.0000 | Freq: Four times a day (QID) | NASAL | 12 refills | Status: DC

## 2020-09-27 NOTE — ED Provider Notes (Signed)
[facility]    CSN: [id number] Arrival date & time: 09/27/20  1223      History   Chief Complaint Chief Complaint  Patient presents with   Nasal Congestion   Sinus Problem   Facial Pain    HPI Jamie Whitaker is a 49 y.o. female presenting for 5-day history of nasal congestion with thick yellow drainage.  Also states that she has had a cough productive of yellowish sputum.  Admits to feeling a little fatigued.  States her sinus pressure is mild at this time.  Patient says she is taken over-the-counter Mucinex 1-2 times a day for symptoms which has temporarily improve them.  She denies any associated fevers, chills, sweats, ear pain, sore throat, chest pain, difficulty breathing, abdominal pain, nausea, vomiting, diarrhea, changes in smell or taste.  Denies known Covid exposure.  States she did have a Covid test but it was 3 days before her symptoms started.  She is fully vaccinated for Covid.  She states she is pretty sure she has a sinus infection she gets them every year.  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  Patient has no other concerns today.  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      Home Medications    Prior to Admission medications   Medication Sig Start Date End Date Taking? Authorizing Provider  atorvastatin (LIPITOR) 20 MG tablet Take 20 mg by mouth daily.    [provider]  benzonatate (TESSALON) 100  MG capsule Take 2 capsules (200 mg total) by mouth 3 (three) times daily as needed for up to 7 days for cough. 09/27/20 10/04/20  Shirlee Latch, PA-C  fexofenadine (ALLEGRA) 180 MG tablet Take 180 mg by mouth daily.    [provider]  fluticasone (FLONASE) 50 MCG/ACT nasal spray Place 1 spray into both nostrils daily. 11/07/19   LampteyBritta Mccreedy, MD  gabapentin (NEURONTIN) 300 MG capsule Take 300 mg by mouth 3 (three) times daily.    [provider]  ipratropium (ATROVENT) 0.06 % nasal spray Place 2 sprays into both nostrils 4 (four) times daily. 09/27/20   Shirlee Latch, PA-C  levothyroxine (SYNTHROID, LEVOTHROID) 25 MCG tablet Take 25 mcg by mouth daily before breakfast.    [provider]  naproxen (NAPROSYN) 500 MG tablet Take 500 mg by mouth 2 (two) times daily with a meal.    [provider]  omeprazole (PRILOSEC) 20 MG capsule Take 20 mg by mouth daily.    [provider]  triamterene-hydrochlorothiazide (MAXZIDE) 75-50 MG tablet Take 1 tablet by mouth daily.    [provider]  Vitamin D, Ergocalciferol, (DRISDOL) 1.25 MG (50000 UT) CAPS capsule Take 50,000 Units by mouth once a week. 10/11/19   [provider]  citalopram (CELEXA) 20 MG tablet Take 20 mg by mouth daily.  11/07/19  [provider]  loratadine (CLARITIN)  10 MG tablet Take 10 mg by mouth daily.  11/07/19  [provider]    Family History Family History  Problem Relation Age of Onset   Renal Disease Mother     Social History Social History   Tobacco Use   Smoking status: Never Smoker   Smokeless tobacco: Never Used  Building services engineer Use: Never used  Substance Use Topics   Alcohol use: Yes    Comment: Moonshine 3 shots per day   Drug use: Yes    Types: Marijuana    Comment: 2 blunts per month      Allergies   Patient has no known allergies.   Review of Systems Review of Systems  Constitutional: Positive for  fatigue. Negative for chills, diaphoresis and fever.  HENT: Positive for congestion, rhinorrhea, sinus pressure and sinus pain. Negative for ear pain and sore throat.   Respiratory: Positive for cough. Negative for shortness of breath.   Cardiovascular: Negative for chest pain.  Gastrointestinal: Negative for abdominal pain, nausea and vomiting.  Musculoskeletal: Negative for arthralgias and myalgias.  Skin: Negative for rash.  Neurological: Negative for weakness and headaches.  Hematological: Negative for adenopathy.     Physical Exam Triage Vital Signs ED Triage Vitals  Enc Vitals Group     BP 09/27/20 1237 (!) 141/93     Pulse Rate 09/27/20 1237 86     Resp 09/27/20 1237 18     Temp 09/27/20 1237 98.6 F (37 C)     Temp Source 09/27/20 1237 Oral     SpO2 09/27/20 1237 99 %     Weight 09/27/20 1234 223 lb (101.2 kg)     Height 09/27/20 1234 5\' 1"  (1.549 m)     Head Circumference --      Peak Flow --      Pain Score 09/27/20 1233 5     Pain Loc --      Pain Edu? --      Excl. in GC? --    No data found.  Updated Vital Signs BP (!) 141/93 (BP Location: Right Arm)    Pulse 86    Temp 98.6 F (37 C) (Oral)    Resp 18    Ht 5\' 1"  (1.549 m)    Wt 223 lb (101.2 kg)    LMP 09/07/2020    SpO2 99%    BMI 42.14 kg/m      Physical Exam Vitals and nursing note reviewed.  Constitutional:      General: She is not in acute distress.    Appearance: Normal appearance. She is not ill-appearing or toxic-appearing.  HENT:     Head: Normocephalic and atraumatic.     Right Ear: Tympanic membrane, ear canal and external ear normal.     Left Ear: Tympanic membrane, ear canal and external ear normal.     Nose: Mucosal edema, congestion and rhinorrhea present.     Right Sinus: No maxillary sinus tenderness or frontal sinus tenderness.     Left Sinus: No maxillary sinus tenderness or frontal sinus tenderness.     Mouth/Throat:     Mouth: Mucous membranes are moist.     Pharynx: Oropharynx  is clear. Posterior oropharyngeal erythema (with clear PND) present.  Eyes:     General: No scleral icterus.       Right eye: No discharge.        Left eye: No discharge.     Conjunctiva/sclera: Conjunctivae normal.  Cardiovascular:  Rate and Rhythm: Normal rate and regular rhythm.     Heart sounds: Normal heart sounds.  Pulmonary:     Effort: Pulmonary effort is normal. No respiratory distress.     Breath sounds: Normal breath sounds.  Musculoskeletal:     Cervical back: Neck supple.  Skin:    General: Skin is dry.  Neurological:     General: No focal deficit present.     Mental Status: She is alert. Mental status is at baseline.     Motor: No weakness.     Gait: Gait normal.  Psychiatric:        Mood and Affect: Mood normal.        Behavior: Behavior normal.        Thought Content: Thought content normal.      UC Treatments / Results  Labs (all labs ordered are listed, but only abnormal results are displayed) Labs Reviewed  RESP PANEL BY RT-PCR (RSV, FLU A&B, COVID)  [id number]    EKG   Radiology No results found.  Procedures Procedures (including critical care time)  Medications Ordered in UC Medications - No data to display  Initial Impression / Assessment and Plan / UC Course  I have reviewed the triage vital signs and the nursing notes.  Pertinent labs & imaging results that were available during my care of the patient were reviewed by me and considered in my medical decision making (see chart for details).   49 year old female presenting for 5-day history of sinus congestion and cough.  On exam she has nasal mucosal edema but no sinus tenderness.  All vital signs are stable.  She is afebrile.  Chest is clear to auscultation.  Respiratory panel obtained today to assess for possible flu or Covid.  Advised patient that I will call her with the results.  Explained patient that she has been ill for 5 days and this is not typically consistent with a sinus  infection.  Advised that guidelines are to wait at least 10 days from having symptoms have not getting better before starting antibiotics.  Advised her they are not indicated at this time.  I have sent Atrovent nasal spray and Tessalon Perles.  Advised her to increase her fluid intake and rest.  Follow-up for any new or worsening symptoms or if she is not feeling better after 10 days.  Respiratory panel is negative.   Final Clinical Impressions(s) / UC Diagnoses   Final diagnoses:  Upper respiratory tract infection, unspecified type  Cough  Nasal congestion     Discharge Instructions     You have received COVID testing today either for positive exposure, concerning symptoms that could be related to COVID infection, screening purposes, or re-testing after confirmed positive.  Your test obtained today checks for active viral infection in the last 1-2 weeks. If your test is negative now, you can still test positive later. So, if you do develop symptoms you should either get re-tested and/or isolate x 10 days. Please follow CDC guidelines.  While Rapid antigen tests come back in 15-20 minutes, send out PCR/molecular test results typically come back within 24 hours. In the mean time, if you are symptomatic, assume this could be a positive test and treat/monitor yourself as if you do have COVID.   We will call with test results. Please download the MyChart app and set up a profile to access test results.   If symptomatic, go home and rest. Push fluids. Take Tylenol as needed for discomfort. Gargle  warm salt water. Throat lozenges. Take Mucinex DM or Robitussin for cough. Humidifier in bedroom to ease coughing. Warm showers. Also review the COVID handout for more information.  COVID-19 INFECTION: The incubation period of COVID-19 is approximately 14 days after exposure, with most symptoms developing in roughly 4-5 days. Symptoms may range in severity from mild to critically severe. Roughly 80% of  those infected will have mild symptoms. People of any age may become infected with COVID-19 and have the ability to transmit the virus. The most common symptoms include: fever, fatigue, cough, body aches, headaches, sore throat, nasal congestion, shortness of breath, nausea, vomiting, diarrhea, changes in smell and/or taste.    COURSE OF ILLNESS Some patients may begin with mild disease which can progress quickly into critical symptoms. If your symptoms are worsening please call ahead to the Emergency Department and proceed there for further treatment. Recovery time appears to be roughly 1-2 weeks for mild symptoms and 3-6 weeks for severe disease.   GO IMMEDIATELY TO ER FOR FEVER YOU ARE UNABLE TO GET DOWN WITH TYLENOL, BREATHING PROBLEMS, CHEST PAIN, FATIGUE, LETHARGY, INABILITY TO EAT OR DRINK, ETC  QUARANTINE AND ISOLATION: To help decrease the spread of COVID-19 please remain isolated if you have COVID infection or are highly suspected to have COVID infection. This means -stay home and isolate to one room in the home if you live with others. Do not share a bed or bathroom with others while ill, sanitize and wipe down all countertops and keep common areas clean and disinfected. You may discontinue isolation if you have a mild case and are asymptomatic 10 days after symptom onset as long as you have been fever free >24 hours without having to take Motrin or Tylenol. If your case is more severe (meaning you develop pneumonia or are admitted in the hospital), you may have to isolate longer.   If you have been in close contact (within 6 feet) of someone diagnosed with COVID 19, you are advised to quarantine in your home for 14 days as symptoms can develop anywhere from 2-14 days after exposure to the virus. If you develop symptoms, you  must isolate.  Most current guidelines for COVID after exposure -isolate 10 days if you ARE NOT tested for COVID as long as symptoms do not develop -isolate 7 days if you  are tested and remain asymptomatic -You do not necessarily need to be tested for COVID if you have + exposure and        develop   symptoms. Just isolate at home x10 days from symptom onset During this global pandemic, CDC advises to practice social distancing, try to stay at least 646ft away from others at all times. Wear a face covering. Wash and sanitize your hands regularly and avoid going anywhere that is not necessary.  KEEP IN MIND THAT THE COVID TEST IS NOT 100% ACCURATE AND YOU SHOULD STILL DO EVERYTHING TO PREVENT POTENTIAL SPREAD OF VIRUS TO OTHERS (WEAR MASK, WEAR GLOVES, WASH HANDS AND SANITIZE REGULARLY). IF INITIAL TEST IS NEGATIVE, THIS MAY NOT MEAN YOU ARE DEFINITELY NEGATIVE. MOST ACCURATE TESTING IS DONE 5-7 DAYS AFTER EXPOSURE.   It is not advised by CDC to get re-tested after receiving a positive COVID test since you can still test positive for weeks to months after you have already cleared the virus.   *If you have not been vaccinated for COVID, I strongly suggest you consider getting vaccinated as long as there are no contraindications.  ED Prescriptions    Medication Sig Dispense Auth. Provider   benzonatate (TESSALON) 100 MG capsule Take 2 capsules (200 mg total) by mouth 3 (three) times daily as needed for up to 7 days for cough. 21 capsule Eusebio Friendly B, PA-C   ipratropium (ATROVENT) 0.06 % nasal spray Place 2 sprays into both nostrils 4 (four) times daily. 15 mL Shirlee Latch, PA-C     PDMP not reviewed this encounter.   Shirlee Latch, PA-C 09/27/20 1424

## 2020-09-27 NOTE — Telephone Encounter (Signed)
Pt advised of covid results.

## 2020-09-27 NOTE — Discharge Instructions (Addendum)

## 2020-09-27 NOTE — ED Triage Notes (Addendum)
Pt with sinus symptoms starting over the weekend. Sinus congestion, blowing yellow from her nose, some blood. Feels like her usual sinus infection. Pressure over her eyes. Negative COVID test and is tested weekly at work.

## 2021-12-30 ENCOUNTER — Other Ambulatory Visit: Payer: Self-pay

## 2021-12-30 DIAGNOSIS — Z1231 Encounter for screening mammogram for malignant neoplasm of breast: Secondary | ICD-10-CM

## 2022-01-01 ENCOUNTER — Ambulatory Visit: Payer: Self-pay | Attending: Oncology | Admitting: *Deleted

## 2022-01-01 ENCOUNTER — Ambulatory Visit
Admission: RE | Admit: 2022-01-01 | Discharge: 2022-01-01 | Disposition: A | Discharge status: Home / Self Care | Payer: Self-pay | Source: Ambulatory Visit | Attending: Obstetrics and Gynecology | Admitting: Obstetrics and Gynecology

## 2022-01-01 ENCOUNTER — Other Ambulatory Visit: Payer: Self-pay

## 2022-01-01 VITALS — BP 133/91 | HR 93 | Resp 18 | Wt 242.0 lb

## 2022-01-01 DIAGNOSIS — Z1239 Encounter for other screening for malignant neoplasm of breast: Secondary | ICD-10-CM

## 2022-01-01 DIAGNOSIS — Z1231 Encounter for screening mammogram for malignant neoplasm of breast: Secondary | ICD-10-CM | POA: Insufficient documentation

## 2022-01-01 NOTE — Patient Instructions (Signed)
Explained breast self awareness with Omar Person. Patient did not need a Pap smear today due to last Pap smear and HPV typing was 04/25/2021. Let her know [facility] will cover Pap smears and HPV typing every 5 years unless has a history of abnormal Pap smears. Referred patient to the [facility] for a screening mammogram. Appointment scheduled Wednesday, January 01, 2022 at 1140. Patient aware of appointment and will be there. Let patient know [facility] will follow up with her within the next couple weeks with results of her mammogram by letter or phone. Omar Person verbalized understanding.  Lenee Franze, Kathaleen Maser, RN 11:12 AM

## 2022-01-01 NOTE — Progress Notes (Signed)
[name] is a 51 y.o. female who presents to [facility] clinic today with no complaints.    Pap Smear: Pap smear not completed today. Last Pap smear was 04/25/2021 at [facility] clinic and was normal with negative HPV. Per patient has no history of an abnormal Pap smear. Last Pap smear result is not available in Epic. Last Pap smear result will be scanned into Epic.   Physical exam: Breasts Left breast larger than right breast that per patient has not noticed any changes. No skin abnormalities bilateral breasts. No nipple retraction bilateral breasts. No nipple discharge bilateral breasts. No lymphadenopathy. No lumps palpated bilateral breasts. No complaints of pain or tenderness on exam.     Pelvic/Bimanual Pap is not indicated today per [facility] guidelines   Smoking History: Patient currently smokes Marijuana.   Patient Navigation: Patient education provided. Access to services provided for patient through [facility] program.   Colorectal Cancer Screening: Per patient has had colonoscopy completed on 01/05/2017 at [facility] that a repeat colonoscopy is recommended at age 76.  No complaints today.    Breast and Cervical Cancer Risk Assessment: Patient has family history of a maternal cousin having breast cancer. Patient has no known genetic mutations or history of radiation treatment to the chest before age 16. Patient does not have history of cervical dysplasia, immunocompromised, or DES exposure in-utero.  Risk Assessment     Risk Scores       01/01/2022   Last edited by: Lesle Chris, RN   5-year risk: 1 %   Lifetime risk: 8.9 %            A: [facility] exam without pap smear No complaints.  P: Referred patient to the [facility] for a screening mammogram. Appointment scheduled Wednesday, January 01, 2022 at 1140.  Priscille Heidelberg, RN 01/01/2022 11:11 AM

## 2022-01-02 ENCOUNTER — Inpatient Hospital Stay
Admission: RE | Admit: 2022-01-02 | Discharge: 2022-01-02 | Disposition: A | Discharge status: Home / Self Care | Payer: Self-pay | Source: Ambulatory Visit | Attending: *Deleted | Admitting: *Deleted

## 2022-01-02 ENCOUNTER — Other Ambulatory Visit: Payer: Self-pay | Admitting: *Deleted

## 2022-01-02 DIAGNOSIS — Z1231 Encounter for screening mammogram for malignant neoplasm of breast: Secondary | ICD-10-CM

## 2022-09-08 ENCOUNTER — Ambulatory Visit
Admission: EM | Admit: 2022-09-08 | Discharge: 2022-09-08 | Disposition: A | Discharge status: Home / Self Care | Payer: Self-pay | Attending: Emergency Medicine | Admitting: Emergency Medicine

## 2022-09-08 ENCOUNTER — Encounter: Payer: Self-pay | Admitting: Emergency Medicine

## 2022-09-08 DIAGNOSIS — J069 Acute upper respiratory infection, unspecified: Secondary | ICD-10-CM

## 2022-09-08 DIAGNOSIS — H66003 Acute suppurative otitis media without spontaneous rupture of ear drum, bilateral: Secondary | ICD-10-CM

## 2022-09-08 MED ORDER — IPRATROPIUM BROMIDE 0.06 % NA SOLN: 2.0000 | Freq: Four times a day (QID) | NASAL | 12 refills | Status: DC

## 2022-09-08 MED ORDER — AMOXICILLIN-POT CLAVULANATE 875-125 MG PO TABS: 1.0000 | ORAL_TABLET | Freq: Two times a day (BID) | ORAL | 0 refills | Status: AC

## 2022-09-08 MED ORDER — PROMETHAZINE-DM 6.25-15 MG/5ML PO SYRP: 5.0000 mL | ORAL_SOLUTION | Freq: Four times a day (QID) | ORAL | 0 refills | Status: DC | PRN

## 2022-09-08 MED ORDER — ONDANSETRON 8 MG PO TBDP: 8.0000 mg | ORAL_TABLET | Freq: Three times a day (TID) | ORAL | 0 refills | Status: DC | PRN

## 2022-09-08 MED ORDER — BENZONATATE 100 MG PO CAPS: 200.0000 mg | ORAL_CAPSULE | Freq: Three times a day (TID) | ORAL | 0 refills | Status: DC

## 2022-09-08 NOTE — Discharge Instructions (Signed)
Take the Augmentin twice daily for 10 days with food for treatment of your ear infection.  Take an over-the-counter probiotic 1 hour after each dose of antibiotic to prevent diarrhea.  Use over-the-counter Tylenol and ibuprofen as needed for pain or fever.  Place a hot water bottle, or heating pad, underneath your pillowcase at night to help dilate up your ear and aid in pain relief as well as resolution of the infection.  Use the Atrovent nasal spray, 2 squirts in each nostril every 6 hours, as needed for runny nose and postnasal drip.  Use the Tessalon Perles every 8 hours during the day.  Take them with a small sip of water.  They may give you some numbness to the base of your tongue or a metallic taste in your mouth, this is normal.  Use the Promethazine DM cough syrup at bedtime for cough and congestion.  It will make you drowsy so do not take it during the day.  Use the Zofran every 8 hours as needed for nausea and vomiting.   Return for reevaluation for any new or worsening symptoms.

## 2022-09-08 NOTE — ED Triage Notes (Signed)
Pt presents with a cough and runny nose x 1 wee. Today she developed sinus pressure and vomited twice.

## 2022-09-08 NOTE — ED Provider Notes (Signed)
[facility]    CSN: [id number] Arrival date & time: 09/08/22  1102      History   Chief Complaint Chief Complaint  Patient presents with   Nasal Congestion   Sinus pressure    Cough    HPI Jamie Whitaker is a 51 y.o. female.   HPI  51 year old female here for evaluation of respiratory complaints.  Patient reports that for the last week she has been experiencing runny nose with intermittent yellow discharge, left ear pain, intermittent productive cough for yellow sputum, and had a single episode of emesis for yellow bile this morning.  She denies any fever, drainage from her ear, sore throat, shortness breath, or wheezing.  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      Home Medications    Prior to Admission medications   Medication Sig Start Date End Date Taking? Authorizing Provider  amoxicillin-clavulanate (AUGMENTIN) 875-125 MG tablet Take 1 tablet by mouth every 12 (twelve) hours for 10 days. 09/08/22 09/18/22 Yes Becky Augusta, NP  benzonatate (TESSALON) 100 MG capsule Take 2 capsules (200 mg total) by mouth every 8 (eight) hours. 09/08/22  Yes Becky Augusta, NP  ipratropium (ATROVENT) 0.06 % nasal spray Place 2 sprays into both nostrils 4 (four) times daily. 09/08/22  Yes Becky Augusta, NP  ondansetron (ZOFRAN-ODT) 8 MG disintegrating tablet Take 1  tablet (8 mg total) by mouth every 8 (eight) hours as needed for nausea or vomiting. 09/08/22  Yes Becky Augusta, NP  promethazine-dextromethorphan (PROMETHAZINE-DM) 6.25-15 MG/5ML syrup Take 5 mLs by mouth 4 (four) times daily as needed. 09/08/22  Yes Becky Augusta, NP  acetaminophen-codeine (TYLENOL #3) 300-30 MG tablet Take by mouth daily. 10/18/21   [provider]  atorvastatin (LIPITOR) 20 MG tablet Take 20 mg by mouth daily.    [provider]  citalopram (CELEXA) 40 MG tablet Take 40 mg by mouth daily. 12/16/21   [provider]  fexofenadine (ALLEGRA) 180 MG tablet Take 180 mg by mouth daily.    [provider]  fluticasone (FLONASE) 50 MCG/ACT nasal spray Place 1 spray into both nostrils daily. Patient not taking: Reported on 01/01/2022 11/07/19   Merrilee Jansky, MD  gabapentin (NEURONTIN) 300 MG capsule Take 300 mg by mouth 3 (three) times daily.    [provider]  levothyroxine (SYNTHROID, LEVOTHROID) 25 MCG tablet Take 25 mcg by mouth daily before breakfast.    [provider]  losartan (COZAAR) 25 MG tablet Take 25 mg by mouth daily. 11/15/21   [provider]  naproxen (NAPROSYN) 500 MG tablet Take 500 mg by mouth 2 (two) times daily with a meal. Patient not taking: Reported on 01/01/2022    [provider]  Olopatadine HCl 0.2 % SOLN Apply to eye. 12/17/21   [provider]  omeprazole (PRILOSEC) 20 MG capsule Take 20 mg by mouth daily.    [provider]  potassium chloride (KLOR-CON M) 10 MEQ tablet Take 10 mEq by mouth daily. 11/15/21   [provider]  traMADol (ULTRAM) 50 MG tablet Take 50 mg by mouth 2 (two) times daily as needed. 07/30/21   [provider]  triamterene-hydrochlorothiazide (MAXZIDE) 75-50 MG tablet Take 1 tablet by mouth daily.    [provider]  Vitamin D, Ergocalciferol, (DRISDOL) 1.25 MG (50000 UT) CAPS capsule Take 50,000 Units by mouth once a  week. Patient not taking: Reported on 01/01/2022 10/11/19   [provider]  loratadine (CLARITIN) 10 MG tablet Take 10 mg by mouth daily.  11/07/19  [provider]    Family History Family History  Problem Relation Age of Onset   Renal Disease Mother    Cancer Other     Social History Social History   Tobacco Use   Smoking status: Never   Smokeless tobacco: Never  Vaping Use   Vaping Use: Never used  Substance Use Topics   Alcohol use: Yes    Comment: Moonshine 3 shots per day   Drug use: Yes    Types: Marijuana    Comment: 2 blunts per month      Allergies   Patient has no known allergies.   Review of Systems Review of Systems  Constitutional:  Negative for fever.  HENT:  Positive for congestion, ear pain and rhinorrhea. Negative for ear discharge and sore throat.   Respiratory:  Positive for cough. Negative for shortness of breath and wheezing.   Gastrointestinal:  Positive for nausea and vomiting. Negative for diarrhea.     Physical Exam Triage Vital Signs ED Triage Vitals  Enc Vitals Group     BP 09/08/22 1151 136/78     Pulse Rate 09/08/22 1151 96     Resp 09/08/22 1151 16     Temp 09/08/22 1151 98 F (36.7 C)     Temp Source 09/08/22 1151 Oral     SpO2 09/08/22 1151 98 %     Weight --      Height --      Head Circumference --      Peak Flow --      Pain Score 09/08/22 1152 0     Pain Loc --      Pain Edu? --      Excl. in [place]? --    No data found.  Updated Vital Signs BP 136/78 (BP Location: Right Arm)   Pulse 96   Temp 98 F (36.7 C) (Oral)   Resp 16   SpO2 98%   Visual Acuity Right Eye Distance:   Left Eye Distance:   Bilateral Distance:    Right Eye Near:   Left Eye Near:    Bilateral Near:     Physical Exam Vitals and nursing note reviewed.  Constitutional:      Appearance: Normal appearance. She is obese. She is not ill-appearing.  HENT:     Head: Normocephalic and atraumatic.     Right Ear: Ear canal  and external ear normal. There is no impacted cerumen.     Left Ear: Ear canal and external ear normal. There is no impacted cerumen.     Ears:     Comments: Both tympanic membranes are erythematous and injected.  No effusion noted.  Both external auditory canals are clear.    Nose: Congestion and rhinorrhea present.  Comments: Nasal mucosa is erythematous and edematous with clear rhinorrhea in both nares.    Mouth/Throat:     Mouth: Mucous membranes are moist.     Pharynx: Oropharynx is clear. Posterior oropharyngeal erythema present.     Comments: Tonsillar pillars are surgically absent.  Posterior oropharynx has mild erythema but no injection.  There is clear postnasal drip on exam. Cardiovascular:     Rate and Rhythm: Normal rate and regular rhythm.     Pulses: Normal pulses.     Heart sounds: Normal heart sounds. No murmur heard.    No friction rub. No gallop.  Pulmonary:     Effort: Pulmonary effort is normal.     Breath sounds: Normal breath sounds. No wheezing, rhonchi or rales.  Musculoskeletal:     Cervical back: Normal range of motion and neck supple.  Lymphadenopathy:     Cervical: No cervical adenopathy.  Skin:    General: Skin is warm and dry.     Capillary Refill: Capillary refill takes less than 2 seconds.     Findings: No erythema or rash.  Neurological:     General: No focal deficit present.     Mental Status: She is alert and oriented to person, place, and time.  Psychiatric:        Mood and Affect: Mood normal.        Behavior: Behavior normal.        Thought Content: Thought content normal.        Judgment: Judgment normal.      UC Treatments / Results  Labs (all labs ordered are listed, but only abnormal results are displayed) Labs Reviewed - No data to display  EKG   Radiology No results found.  Procedures Procedures (including critical care time)  Medications Ordered in UC Medications - No data to display  Initial Impression /  Assessment and Plan / UC Course  I have reviewed the triage vital signs and the nursing notes.  Pertinent labs & imaging results that were available during my care of the patient were reviewed by me and considered in my medical decision making (see chart for details).   Patient is a nontoxic-appearing 51 year old female here for evaluation of respiratory complaints as outlined in the HPI above.  Her physical exam reveals bilateral erythematous injected tympanic membrane's.  No effusions noted in both external auditory canals are clear.  Her nasal mucosa is erythematous and edematous with clear rhinorrhea in both nares.  There is also clear postnasal drip present in the posterior oropharynx.  Lung sounds clear to auscultation all fields.  Her exam is consistent with an upper respiratory infection and bilateral otitis media.  I will discharge her home on Augmentin twice daily for 10 days for treatment of the otitis media and the URI.  Also Atrovent nasal spray to help with nasal congestion, Tessalon Perles and Promethazine DM cough syrup help with cough and congestion.  I have also prescribed Zofran to help her with nausea and vomiting as needed the patient is not nauseous now and has only had a single emesis bout this morning.  I suspect the emesis was a result of her nasal drainage.  Work note and return precautions provided.   Final Clinical Impressions(s) / UC Diagnoses   Final diagnoses:  Upper respiratory tract infection, unspecified type  Non-recurrent acute suppurative otitis media of both ears without spontaneous rupture of tympanic membranes     Discharge Instructions      Take the Augmentin  twice daily for 10 days with food for treatment of your ear infection.  Take an over-the-counter probiotic 1 hour after each dose of antibiotic to prevent diarrhea.  Use over-the-counter Tylenol and ibuprofen as needed for pain or fever.  Place a hot water bottle, or heating pad, underneath your  pillowcase at night to help dilate up your ear and aid in pain relief as well as resolution of the infection.  Use the Atrovent nasal spray, 2 squirts in each nostril every 6 hours, as needed for runny nose and postnasal drip.  Use the Tessalon Perles every 8 hours during the day.  Take them with a small sip of water.  They may give you some numbness to the base of your tongue or a metallic taste in your mouth, this is normal.  Use the Promethazine DM cough syrup at bedtime for cough and congestion.  It will make you drowsy so do not take it during the day.  Use the Zofran every 8 hours as needed for nausea and vomiting.   Return for reevaluation for any new or worsening symptoms.     ED Prescriptions     Medication Sig Dispense Auth. Provider   amoxicillin-clavulanate (AUGMENTIN) 875-125 MG tablet Take 1 tablet by mouth every 12 (twelve) hours for 10 days. 20 tablet Becky Augusta, NP   benzonatate (TESSALON) 100 MG capsule Take 2 capsules (200 mg total) by mouth every 8 (eight) hours. 21 capsule Becky Augusta, NP   ipratropium (ATROVENT) 0.06 % nasal spray Place 2 sprays into both nostrils 4 (four) times daily. 15 mL Becky Augusta, NP   promethazine-dextromethorphan (PROMETHAZINE-DM) 6.25-15 MG/5ML syrup Take 5 mLs by mouth 4 (four) times daily as needed. 118 mL Becky Augusta, NP   ondansetron (ZOFRAN-ODT) 8 MG disintegrating tablet Take 1 tablet (8 mg total) by mouth every 8 (eight) hours as needed for nausea or vomiting. 20 tablet Becky Augusta, NP      PDMP not reviewed this encounter.   Becky Augusta, NP 09/08/22 1222

## 2022-11-08 ENCOUNTER — Ambulatory Visit
Admission: EM | Admit: 2022-11-08 | Discharge: 2022-11-08 | Disposition: A | Discharge status: Home / Self Care | Payer: Self-pay | Attending: Emergency Medicine | Admitting: Emergency Medicine

## 2022-11-08 ENCOUNTER — Encounter: Payer: Self-pay | Admitting: Emergency Medicine

## 2022-11-08 DIAGNOSIS — J069 Acute upper respiratory infection, unspecified: Secondary | ICD-10-CM | POA: Insufficient documentation

## 2022-11-08 DIAGNOSIS — Z20828 Contact with and (suspected) exposure to other viral communicable diseases: Secondary | ICD-10-CM | POA: Insufficient documentation

## 2022-11-08 DIAGNOSIS — Z1152 Encounter for screening for COVID-19: Secondary | ICD-10-CM | POA: Insufficient documentation

## 2022-11-08 DIAGNOSIS — H66001 Acute suppurative otitis media without spontaneous rupture of ear drum, right ear: Secondary | ICD-10-CM | POA: Insufficient documentation

## 2022-11-08 LAB — SARS CORONAVIRUS 2 BY RT PCR: SARS Coronavirus 2 by RT PCR: NEGATIVE

## 2022-11-08 MED ORDER — AMOXICILLIN-POT CLAVULANATE 875-125 MG PO TABS: 1.0000 | ORAL_TABLET | Freq: Two times a day (BID) | ORAL | 0 refills | Status: AC

## 2022-11-08 MED ORDER — PROMETHAZINE-DM 6.25-15 MG/5ML PO SYRP: 5.0000 mL | ORAL_SOLUTION | Freq: Four times a day (QID) | ORAL | 0 refills | Status: AC | PRN

## 2022-11-08 MED ORDER — BENZONATATE 100 MG PO CAPS: 200.0000 mg | ORAL_CAPSULE | Freq: Three times a day (TID) | ORAL | 0 refills | Status: AC

## 2022-11-08 MED ORDER — IPRATROPIUM BROMIDE 0.06 % NA SOLN: 2.0000 | Freq: Four times a day (QID) | NASAL | 12 refills | Status: AC

## 2022-11-08 NOTE — ED Provider Notes (Signed)
[facility]    CSN: [id number] Arrival date & time: 11/08/22  1506      History   Chief Complaint Chief Complaint  Patient presents with   Cough    HPI Jamie Whitaker is a 51 y.o. female.   HPI  51 year old female here for evaluation of respiratory complaints.  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  For the last 4 days she is experienced chills, nasal congestion, cough that is productive for yellow sputum, shortness of breath, and wheezing.  She denies any fever or sore throat.  Patient does have ear fullness and has difficulty hearing.  She does wear cochlear implant in her left ear.  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      Home Medications    Prior to Admission medications   Medication Sig Start Date End Date Taking? Authorizing Provider  amoxicillin-clavulanate (AUGMENTIN) 875-125 MG tablet Take 1 tablet by mouth every 12 (twelve) hours for 10 days. 11/08/22 11/18/22 Yes Becky Augustayan, Candise Crabtree, NP  benzonatate (TESSALON) 100 MG capsule Take 2 capsules (200 mg total) by mouth every 8 (eight) hours. 11/08/22  Yes Becky Augustayan, Evanee Lubrano, NP  ipratropium (ATROVENT) 0.06 % nasal spray Place 2 sprays into both nostrils 4 (four) times daily. 11/08/22  Yes Becky Augustayan, Clotile Whittington, NP  promethazine-dextromethorphan (PROMETHAZINE-DM) 6.25-15 MG/5ML syrup Take 5 mLs by mouth 4 (four) times  daily as needed. 11/08/22  Yes Becky Augustayan, Sophia Cubero, NP  acetaminophen-codeine (TYLENOL #3) 300-30 MG tablet Take by mouth daily. 10/18/21   [provider]  atorvastatin (LIPITOR) 20 MG tablet Take 20 mg by mouth daily.    [provider]  citalopram (CELEXA) 40 MG tablet Take 40 mg by mouth daily. 12/16/21   [provider]  fexofenadine (ALLEGRA) 180 MG tablet Take 180 mg by mouth daily.    [provider]  fluticasone (FLONASE) 50 MCG/ACT nasal spray Place 1 spray into both nostrils daily. Patient not taking: Reported on 01/01/2022 11/07/19   Merrilee JanskyLamptey, Philip O, MD  gabapentin (NEURONTIN) 300 MG capsule Take 300 mg by mouth 3 (three) times daily.    [provider]  levothyroxine (SYNTHROID, LEVOTHROID) 25 MCG tablet Take 25 mcg by mouth daily before breakfast.    [provider]  losartan (COZAAR) 25 MG tablet Take 25 mg by mouth daily. 11/15/21   [provider]  naproxen (NAPROSYN) 500 MG tablet Take 500 mg by mouth 2 (two) times daily with a meal. Patient not taking: Reported on 01/01/2022    [provider]  Olopatadine HCl 0.2 % SOLN Apply to eye. 12/17/21   [provider]  omeprazole (PRILOSEC) 20 MG capsule Take 20 mg by mouth daily.    [provider]  ondansetron (ZOFRAN-ODT) 8 MG disintegrating tablet Take 1 tablet (8 mg total) by mouth every 8 (eight) hours as  needed for nausea or vomiting. 09/08/22   Becky Augusta, NP  potassium chloride (KLOR-CON M) 10 MEQ tablet Take 10 mEq by mouth daily. 11/15/21   [provider]  traMADol (ULTRAM) 50 MG tablet Take 50 mg by mouth 2 (two) times daily as needed. 07/30/21   [provider]  triamterene-hydrochlorothiazide (MAXZIDE) 75-50 MG tablet Take 1 tablet by mouth daily.    [provider]  Vitamin D, Ergocalciferol, (DRISDOL) 1.25 MG (50000 UT) CAPS capsule Take 50,000 Units by mouth once a week. Patient not taking: Reported on 01/01/2022  10/11/19   [provider]  loratadine (CLARITIN) 10 MG tablet Take 10 mg by mouth daily.  11/07/19  [provider]    Family History Family History  Problem Relation Age of Onset   Renal Disease Mother    Cancer Other     Social History Social History   Tobacco Use   Smoking status: Never   Smokeless tobacco: Never  Vaping Use   Vaping Use: Never used  Substance Use Topics   Alcohol use: Yes    Comment: Moonshine 3 shots per day   Drug use: Yes    Types: Marijuana    Comment: 2 blunts per month      Allergies   Patient has no known allergies.   Review of Systems Review of Systems  Constitutional:  Negative for fever.  HENT:  Positive for congestion and rhinorrhea. Negative for ear pain and sore throat.   Respiratory:  Positive for cough, shortness of breath and wheezing.      Physical Exam Triage Vital Signs ED Triage Vitals  Enc Vitals Group     BP 11/08/22 1554 (!) 140/80     Pulse Rate 11/08/22 1554 98     Resp 11/08/22 1554 14     Temp 11/08/22 1554 99.2 F (37.3 C)     Temp Source 11/08/22 1554 Oral     SpO2 11/08/22 1554 97 %     Weight 11/08/22 1551 240 lb (108.9 kg)     Height 11/08/22 1551 5\' 1"  (1.549 m)     Head Circumference --      Peak Flow --      Pain Score 11/08/22 1551 3     Pain Loc --      Pain Edu? --      Excl. in [place]? --    No data found.  Updated Vital Signs BP (!) 140/80 (BP Location: Right Arm)   Pulse 98   Temp 99.2 F (37.3 C) (Oral)   Resp 14   Ht 5\' 1"  (1.549 m)   Wt 240 lb (108.9 kg)   SpO2 97%   BMI 45.35 kg/m   Visual Acuity Right Eye Distance:   Left Eye Distance:   Bilateral Distance:    Right Eye Near:   Left Eye Near:    Bilateral Near:     Physical Exam Vitals and nursing note reviewed.  Constitutional:      Appearance: Normal appearance. She is not ill-appearing.  HENT:     Head: Normocephalic and atraumatic.     Right Ear: Tympanic membrane, ear canal and external ear  normal. There is no impacted cerumen.     Left Ear: Ear canal and external ear normal. There is no impacted cerumen.     Ears:     Comments: Tympanic membrane is erythematous and injected with loss of landmarks.  The EAC is clear.    Nose: Congestion and  rhinorrhea present.     Comments: His mucosa is erythematous and edematous with clear rhinorrhea in both nares.    Mouth/Throat:     Mouth: Mucous membranes are moist.     Pharynx: Oropharynx is clear. No posterior oropharyngeal erythema.  Cardiovascular:     Rate and Rhythm: Normal rate and regular rhythm.     Pulses: Normal pulses.     Heart sounds: Normal heart sounds. No murmur heard.    No friction rub. No gallop.  Pulmonary:     Effort: Pulmonary effort is normal.     Breath sounds: Normal breath sounds. No wheezing, rhonchi or rales.  Musculoskeletal:     Cervical back: Normal range of motion and neck supple.  Lymphadenopathy:     Cervical: No cervical adenopathy.  Skin:    General: Skin is warm and dry.     Capillary Refill: Capillary refill takes less than 2 seconds.     Findings: No erythema or rash.  Neurological:     General: No focal deficit present.     Mental Status: She is alert and oriented to person, place, and time.  Psychiatric:        Mood and Affect: Mood normal.        Behavior: Behavior normal.        Thought Content: Thought content normal.        Judgment: Judgment normal.      UC Treatments / Results  Labs (all labs ordered are listed, but only abnormal results are displayed) Labs Reviewed  SARS CORONAVIRUS 2 BY RT PCR    EKG   Radiology No results found.  Procedures Procedures (including critical care time)  Medications Ordered in UC Medications - No data to display  Initial Impression / Assessment and Plan / UC Course  I have reviewed the triage vital signs and the nursing notes.  Pertinent labs & imaging results that were available during my care of the patient were reviewed by  me and considered in my medical decision making (see chart for details).   Patient is a pleasant, nontoxic-appearing 51 year old female here for evaluation of [place] symptoms as outlined in HPI above.  Her physical exam findings do reveal an erythematous injected left tympanic membrane.  This is the same as she has a cochlear implant in.  She has a previous ear infections and is here in the past.  She does have inflammation of her upper respiratory tract with nasal mucosa being inflamed and clear rhinorrhea and clear postnasal drip.  Lungs are clear to auscultation all fields.  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  If the COVID PCR is negative I will treat her for otitis media with Augmentin twice daily for 10 days.  If her COVID is positive then her ear infection is most likely viral and I will treat her with Paxlovid.  COVID PCR is negative.  I will discharge patient with a diagnosis of URI and otitis media on the left.  I will treat her with Augmentin twice daily for 10 days.  I will also prescribe Atrovent nasal spray, Tessalon Perles, and Promethazine DM cough syrup as needed for cough and congestion.  Supportive care.   Final Clinical Impressions(s) / UC Diagnoses   Final diagnoses:  Upper respiratory tract infection, unspecified type  Non-recurrent acute suppurative otitis media of right ear without spontaneous rupture of tympanic membrane     Discharge Instructions      Take  the Augmentin twice daily for 10 days with food for treatment of your ear infection.  Take an over-the-counter probiotic 1 hour after each dose of antibiotic to prevent diarrhea.  Use over-the-counter Tylenol and ibuprofen as needed for pain or fever.  Place a hot water bottle, or heating pad, underneath your pillowcase at night to help dilate up your ear and aid in pain relief as well as resolution of the infection.  Use the Atrovent nasal spray, 2 squirts in each nostril every 6 hours,  as needed for runny nose and postnasal drip.  Use the Tessalon Perles every 8 hours during the day.  Take them with a small sip of water.  They may give you some numbness to the base of your tongue or a metallic taste in your mouth, this is normal.  Use the Promethazine DM cough syrup at bedtime for cough and congestion.  It will make you drowsy so do not take it during the day.   Return for reevaluation for any new or worsening symptoms.      ED Prescriptions     Medication Sig Dispense Auth. Provider   amoxicillin-clavulanate (AUGMENTIN) 875-125 MG tablet Take 1 tablet by mouth every 12 (twelve) hours for 10 days. 20 tablet Becky Augusta, NP   benzonatate (TESSALON) 100 MG capsule Take 2 capsules (200 mg total) by mouth every 8 (eight) hours. 21 capsule Becky Augusta, NP   ipratropium (ATROVENT) 0.06 % nasal spray Place 2 sprays into both nostrils 4 (four) times daily. 15 mL Becky Augusta, NP   promethazine-dextromethorphan (PROMETHAZINE-DM) 6.25-15 MG/5ML syrup Take 5 mLs by mouth 4 (four) times daily as needed. 118 mL Becky Augusta, NP      PDMP not reviewed this encounter.   Becky Augusta, NP 11/08/22 [phone]

## 2022-11-08 NOTE — Discharge Instructions (Addendum)
Take the Augmentin twice daily for 10 days with food for treatment of your ear infection.  Take an over-the-counter probiotic 1 hour after each dose of antibiotic to prevent diarrhea.  Use over-the-counter Tylenol and ibuprofen as needed for pain or fever.  Place a hot water bottle, or heating pad, underneath your pillowcase at night to help dilate up your ear and aid in pain relief as well as resolution of the infection.  Use the Atrovent nasal spray, 2 squirts in each nostril every 6 hours, as needed for runny nose and postnasal drip.  Use the Tessalon Perles every 8 hours during the day.  Take them with a small sip of water.  They may give you some numbness to the base of your tongue or a metallic taste in your mouth, this is normal.  Use the Promethazine DM cough syrup at bedtime for cough and congestion.  It will make you drowsy so do not take it during the day.  Return for reevaluation for any new or worsening symptoms.  

## 2022-11-08 NOTE — ED Triage Notes (Signed)
Patient c/o cough, chest congestion, and chills that started Tuesday night.  Patient denies fevers.

## 2022-11-28 ENCOUNTER — Ambulatory Visit
Admission: EM | Admit: 2022-11-28 | Discharge: 2022-11-28 | Disposition: A | Discharge status: Home / Self Care | Payer: Self-pay | Attending: Physician Assistant | Admitting: Physician Assistant

## 2022-11-28 ENCOUNTER — Encounter: Payer: Self-pay | Admitting: Emergency Medicine

## 2022-11-28 DIAGNOSIS — Z1152 Encounter for screening for COVID-19: Secondary | ICD-10-CM | POA: Insufficient documentation

## 2022-11-28 DIAGNOSIS — A084 Viral intestinal infection, unspecified: Secondary | ICD-10-CM | POA: Insufficient documentation

## 2022-11-28 LAB — RESP PANEL BY RT-PCR (RSV, FLU A&B, COVID)  RVPGX2
Influenza A by PCR: NEGATIVE
Influenza B by PCR: NEGATIVE
Resp Syncytial Virus by PCR: NEGATIVE
SARS Coronavirus 2 by RT PCR: NEGATIVE

## 2022-11-28 MED ORDER — ONDANSETRON 8 MG PO TBDP: 8.0000 mg | ORAL_TABLET | Freq: Three times a day (TID) | ORAL | 0 refills | Status: AC | PRN

## 2022-11-28 NOTE — ED Triage Notes (Addendum)
Pt c/o diarrhea, nausea, and vomiting. Started about 4 am this morning. Denies fever. Pt states she needs testing and note for work.

## 2022-11-28 NOTE — ED Provider Notes (Signed)
[facility]    CSN: [id number] Arrival date & time: 11/28/22  0849      History   Chief Complaint Chief Complaint  Patient presents with   Emesis   Diarrhea    HPI Jamie Whitaker is a 52 y.o. female.   Presents for evaluation of abdominal pain, nausea, vomiting and diarrhea beginning this morning at approximately 4 AM.  Has not attempted to eat but able to tolerate fluids.  Has attempted use of Alka-Seltzer, probiotic and antidiarrheal medications which have been somewhat helpful.  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  Abdominal pain is intermittent described as cramping.  Denies fevers, URI symptoms.   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      Home Medications    Prior to Admission medications   Medication Sig Start Date End Date Taking? Authorizing Provider  acetaminophen-codeine (TYLENOL #3) 300-30 MG tablet Take by mouth daily. 10/18/21  Yes [provider]  atorvastatin (LIPITOR) 20 MG tablet Take 20 mg by mouth daily.   Yes [provider]  citalopram (CELEXA) 40 MG tablet Take 40 mg by mouth daily. 12/16/21  Yes [provider]  fexofenadine (ALLEGRA) 180 MG tablet Take 180 mg by mouth daily.   Yes [provider]  gabapentin (NEURONTIN) 300 MG capsule Take 300 mg by mouth 3 (three) times daily.   Yes [provider]  levothyroxine (SYNTHROID, LEVOTHROID) 25 MCG  tablet Take 25 mcg by mouth daily before breakfast.   Yes [provider]  losartan (COZAAR) 25 MG tablet Take 25 mg by mouth daily. 11/15/21  Yes [provider]  Olopatadine HCl 0.2 % SOLN Apply to eye. 12/17/21  Yes [provider]  omeprazole (PRILOSEC) 20 MG capsule Take 20 mg by mouth daily.   Yes [provider]  potassium chloride (KLOR-CON M) 10 MEQ tablet Take 10 mEq by mouth daily. 11/15/21  Yes [provider]  traMADol (ULTRAM) 50 MG tablet Take 50 mg by mouth 2 (two) times daily as needed. 07/30/21  Yes [provider]  triamterene-hydrochlorothiazide (MAXZIDE) 75-50 MG tablet Take 1 tablet by mouth daily.   Yes [provider]  benzonatate (TESSALON) 100 MG capsule Take 2 capsules (200 mg total) by mouth every 8 (eight) hours. 11/08/22   Becky Augusta, NP  fluticasone (FLONASE) 50 MCG/ACT nasal spray Place 1 spray into both nostrils daily. Patient not taking: Reported on 01/01/2022 11/07/19   Merrilee Jansky, MD  ipratropium (ATROVENT) 0.06 % nasal spray Place 2 sprays into both nostrils 4 (four) times daily. 11/08/22   Becky Augusta, NP  naproxen (NAPROSYN) 500 MG tablet Take 500 mg by mouth 2 (two) times daily with a meal. Patient not taking: Reported on 01/01/2022    [provider]  ondansetron (ZOFRAN-ODT) 8 MG disintegrating tablet Take 1 tablet (8 mg total) by mouth every 8 (eight)  hours as needed for nausea or vomiting. 09/08/22   Margarette Canada, NP  promethazine-dextromethorphan (PROMETHAZINE-DM) 6.25-15 MG/5ML syrup Take 5 mLs by mouth 4 (four) times daily as needed. 11/08/22   Margarette Canada, NP  Vitamin D, Ergocalciferol, (DRISDOL) 1.25 MG (50000 UT) CAPS capsule Take 50,000 Units by mouth once a week. Patient not taking: Reported on 01/01/2022 10/11/19   [provider]  loratadine (CLARITIN) 10 MG tablet Take 10 mg by mouth daily.  11/07/19  [provider]    Family History Family History   Problem Relation Age of Onset   Renal Disease Mother    Cancer Other     Social History Social History   Tobacco Use   Smoking status: Never   Smokeless tobacco: Never  Vaping Use   Vaping Use: Never used  Substance Use Topics   Alcohol use: Yes    Comment: Moonshine 3 shots per day   Drug use: Yes    Types: Marijuana    Comment: 2 blunts per month      Allergies   Patient has no known allergies.   Review of Systems Review of Systems  Constitutional: Negative.   HENT: Negative.    Respiratory: Negative.    Cardiovascular: Negative.   Gastrointestinal:  Positive for abdominal pain, diarrhea, nausea and vomiting. Negative for abdominal distention, anal bleeding, blood in stool, constipation and rectal pain.     Physical Exam Triage Vital Signs ED Triage Vitals  Enc Vitals Group     BP 11/28/22 0928 (!) 143/91     Pulse Rate 11/28/22 0928 96     Resp 11/28/22 0928 16     Temp 11/28/22 0928 98.3 F (36.8 C)     Temp Source 11/28/22 0928 Oral     SpO2 11/28/22 0928 95 %     Weight 11/28/22 0926 240 lb 1.3 oz (108.9 kg)     Height 11/28/22 0926 5\' 1"  (1.549 m)     Head Circumference --      Peak Flow --      Pain Score 11/28/22 0924 0     Pain Loc --      Pain Edu? --      Excl. in [place]? --    No data found.  Updated Vital Signs BP (!) 143/91 (BP Location: Left Arm)   Pulse 96   Temp 98.3 F (36.8 C) (Oral)   Resp 16   Ht 5\' 1"  (1.549 m)   Wt 240 lb 1.3 oz (108.9 kg)   SpO2 95%   BMI 45.36 kg/m   Visual Acuity Right Eye Distance:   Left Eye Distance:   Bilateral Distance:    Right Eye Near:   Left Eye Near:    Bilateral Near:     Physical Exam Constitutional:      Appearance: Normal appearance.  Eyes:     Extraocular Movements: Extraocular movements intact.  Cardiovascular:     Pulses: Normal pulses.     Heart sounds: Normal heart sounds.  Pulmonary:     Effort: Pulmonary effort is normal.     Breath sounds: Normal breath sounds.   Abdominal:     General: Abdomen is flat. Bowel sounds are normal.     Palpations: Abdomen is soft.     Tenderness: There is abdominal tenderness in the epigastric area.  Neurological:     Mental Status: She is alert and oriented to person, place, and time. Mental status is at baseline.  UC Treatments / Results  Labs (all labs ordered are listed, but only abnormal results are displayed) Labs Reviewed  RESP PANEL BY RT-PCR (RSV, FLU A&B, COVID)  [id number]    EKG   Radiology No results found.  Procedures Procedures (including critical care time)  Medications Ordered in UC Medications - No data to display  Initial Impression / Assessment and Plan / UC Course  I have reviewed the triage vital signs and the nursing notes.  Pertinent labs & imaging results that were available during my care of the patient were reviewed by me and considered in my medical decision making (see chart for details).  Viral gastroenteritis  Vital signs are stable patient is in no signs of distress nontoxic-appearing, mild tenderness noted over the epigastric region, expected with persistent vomiting and diarrhea morning, low suspicion for an acute abdomen, COVID, flu and RSV testing negative, discussed with patient, prescribe Zofran for outpatient use and recommended increase fluid intake and food as tolerated until symptoms are resolved, may follow-up if symptoms persist or worsen, work note given Final Clinical Impressions(s) / UC Diagnoses   Final diagnoses:  None   Discharge Instructions   None    ED Prescriptions   None    PDMP not reviewed this encounter.   Hans Eden, NP 11/28/22 1022

## 2022-11-28 NOTE — Discharge Instructions (Signed)
Your symptoms are most likely caused by a virus, it will work its way out your system over the next few days  You can use zofran every 8 hours as needed for nausea, be mindful this medication may make you drowsy, take the first dose at home to see how it affects your body  You can use Imodium tto help with diarrhea, and be mindful over use of this medication may cause opposite effect constipation  You can use over-the-counter ibuprofen or Tylenol, which ever you have at home, to help manage fevers  Continue to promote hydration throughout the day by using electrolyte replacement solution such as Gatorade, body armor, Pedialyte, which ever you have at home  Try eating bland foods such as bread, rice, toast, fruit which are easier on the stomach to digest, avoid foods that are overly spicy, overly seasoned or greasy

## 2023-01-05 ENCOUNTER — Encounter: Payer: Self-pay | Admitting: Family Medicine

## 2023-01-05 DIAGNOSIS — Z1231 Encounter for screening mammogram for malignant neoplasm of breast: Secondary | ICD-10-CM

## 2023-01-16 ENCOUNTER — Other Ambulatory Visit: Payer: Self-pay

## 2023-01-16 DIAGNOSIS — Z1231 Encounter for screening mammogram for malignant neoplasm of breast: Secondary | ICD-10-CM

## 2023-01-27 ENCOUNTER — Ambulatory Visit
Admission: RE | Admit: 2023-01-27 | Discharge: 2023-01-27 | Disposition: A | Discharge status: Home / Self Care | Payer: Self-pay | Source: Ambulatory Visit | Attending: Obstetrics and Gynecology | Admitting: Obstetrics and Gynecology

## 2023-01-27 ENCOUNTER — Ambulatory Visit: Payer: Self-pay | Attending: Hematology and Oncology | Admitting: Hematology and Oncology

## 2023-01-27 VITALS — BP 137/95 | Wt 245.2 lb

## 2023-01-27 DIAGNOSIS — Z1231 Encounter for screening mammogram for malignant neoplasm of breast: Secondary | ICD-10-CM

## 2023-01-27 NOTE — Progress Notes (Signed)
[name] is a 52 y.o. female who presents to [facility] clinic today with no complaints.    Pap Smear: Pap not smear completed today. Last Pap smear was 2022 at [facility] and was normal. Per patient has no history of an abnormal Pap smear. Last Pap smear result is available in Epic.   Physical exam: Breasts Breasts symmetrical. No skin abnormalities bilateral breasts. No nipple retraction bilateral breasts. No nipple discharge bilateral breasts. No lymphadenopathy. No lumps palpated bilateral breasts.  MS DIGITAL SCREENING TOMO BILATERAL  Result Date: 01/02/2022 CLINICAL DATA:  Screening. EXAM: DIGITAL SCREENING BILATERAL MAMMOGRAM WITH TOMOSYNTHESIS AND CAD TECHNIQUE: Bilateral screening digital craniocaudal and mediolateral oblique mammograms were obtained. Bilateral screening digital breast tomosynthesis was performed. The images were evaluated with computer-aided detection. COMPARISON:  Previous exam(s). ACR Breast Density Category b: There are scattered areas of fibroglandular density. FINDINGS: There are no findings suspicious for malignancy. IMPRESSION: No mammographic evidence of malignancy. A result letter of this screening mammogram will be mailed directly to the patient. RECOMMENDATION: Screening mammogram in one year. (Code:SM-B-01Y) BI-RADS CATEGORY  1: Negative. Electronically Signed   By: Abelardo Diesel M.D.   On: 01/02/2022 10:10        Pelvic/Bimanual Pap is not indicated today    Smoking History: Patient has never smoked and was not referred to quit line.    Patient Navigation: Patient education provided. Access to services provided for patient through [facility] program. No interpreter provided. No transportation provided   Colorectal Cancer Screening: Per patient has had colonoscopy completed on 12/2016 with benign results. Will follow up in 2028.  No complaints today.    Breast and Cervical Cancer Risk Assessment: Patient does not have family history of breast  cancer, known genetic mutations, or radiation treatment to the chest before age 30. Patient does not have history of cervical dysplasia, immunocompromised, or DES exposure in-utero.  Risk Assessment   No risk assessment data for the current encounter  Risk Scores       01/01/2022   Last edited by: Drue Dun, RN   5-year risk: 1 %   Lifetime risk: 8.9 %            A: [facility] exam without pap smear No complaints with benign exam.   P: [facility]. Appointment scheduled 01/27/2023.  [name], NP 01/27/2023 1:12 PM

## 2023-01-27 NOTE — Patient Instructions (Signed)
[place] about self breast awareness and gave educational materials to take home. Patient did not need a Pap smear today due to last Pap smear was in 2022 per patient. Let her know [facility] will cover Pap smears every 5 years unless has a history of abnormal Pap smears. [facility]. Appointment scheduled for 01/27/23. Patient aware of appointment and will be there. Let patient know will follow up with her within the next couple weeks with results. Jamie Whitaker verbalized understanding.  Melodye Ped, NP 1:13 PM

## 2023-12-06 IMAGING — MG MM DIGITAL SCREENING BILAT W/ TOMO AND CAD
8 of 14 series · 8 of 40 positions shown · non-contrast
Comparison: Previous exam(s).

CLINICAL DATA: Screening.

EXAM:
DIGITAL SCREENING BILATERAL MAMMOGRAM WITH TOMOSYNTHESIS AND CAD
TECHNIQUE: Bilateral screening digital craniocaudal and mediolateral oblique
mammograms were obtained. Bilateral screening digital breast
tomosynthesis was performed. The images were evaluated with
computer-aided detection.

[L CC synth-2D (1 of 2)]
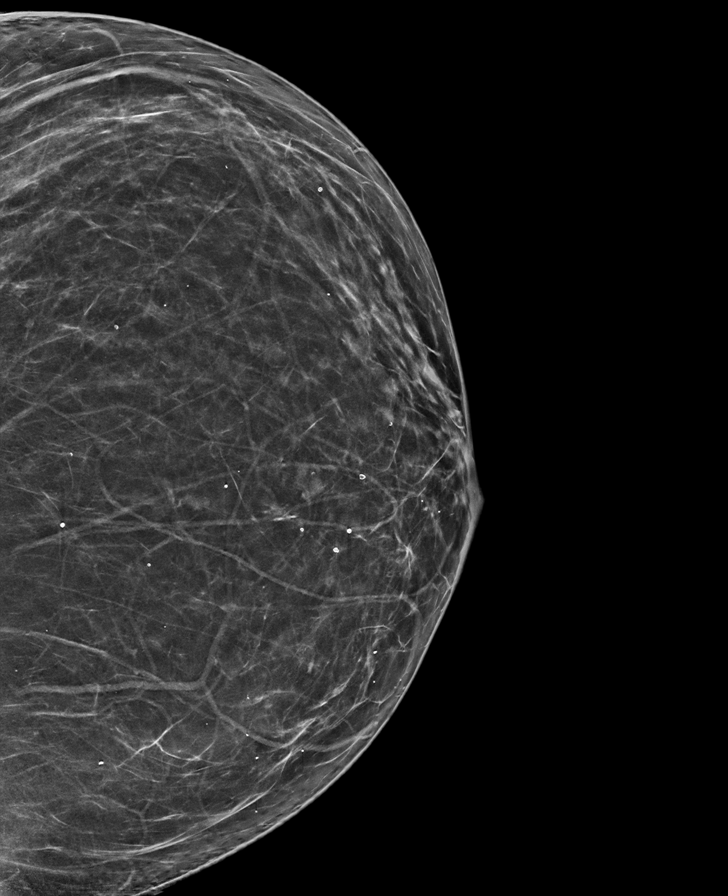

[L MLO synth-2D]
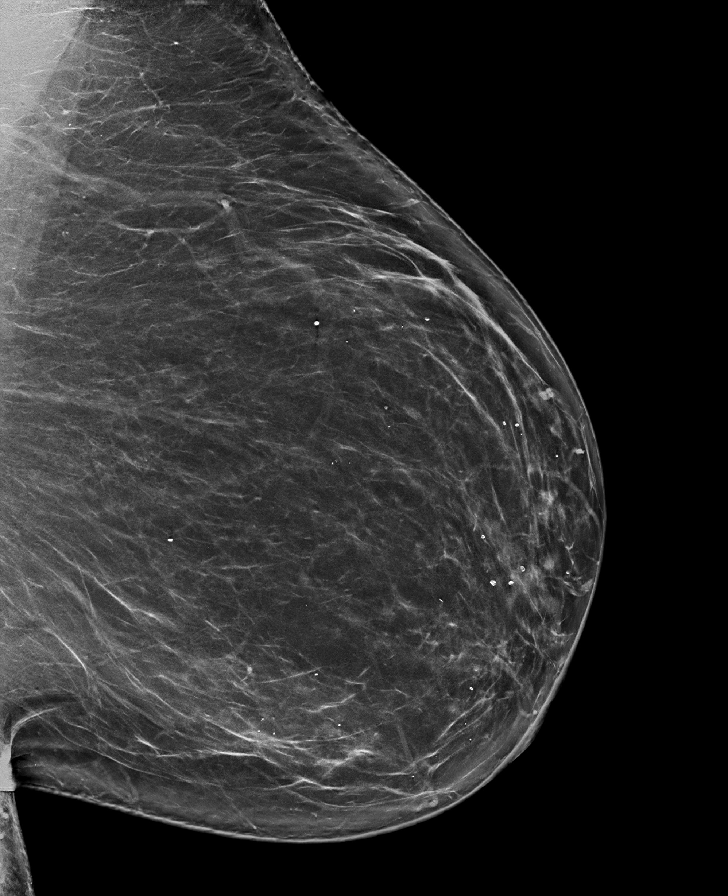

[R CC synth-2D (1 of 2)]
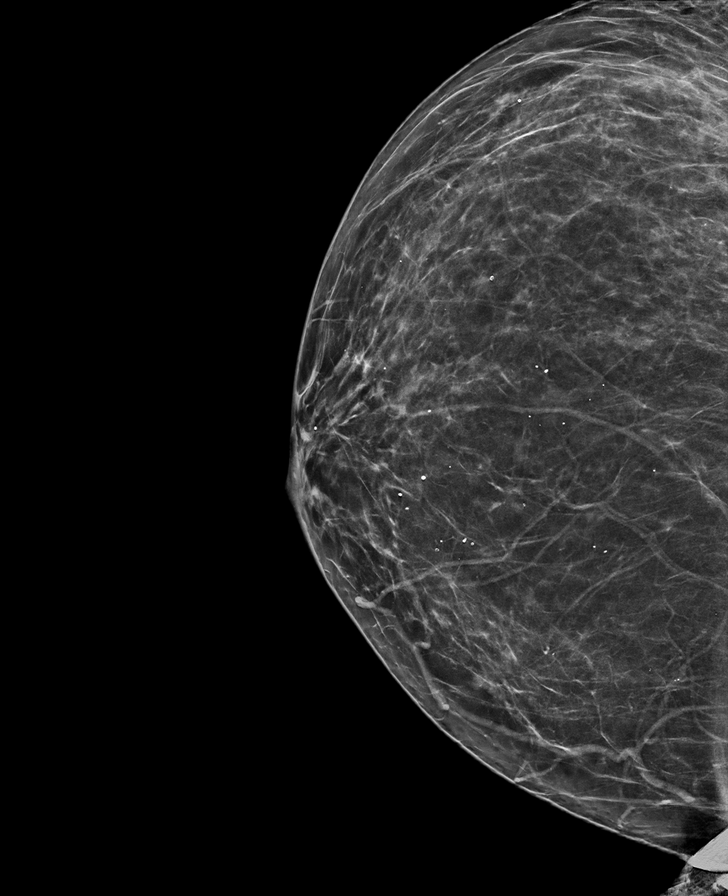

[R MLO synth-2D]
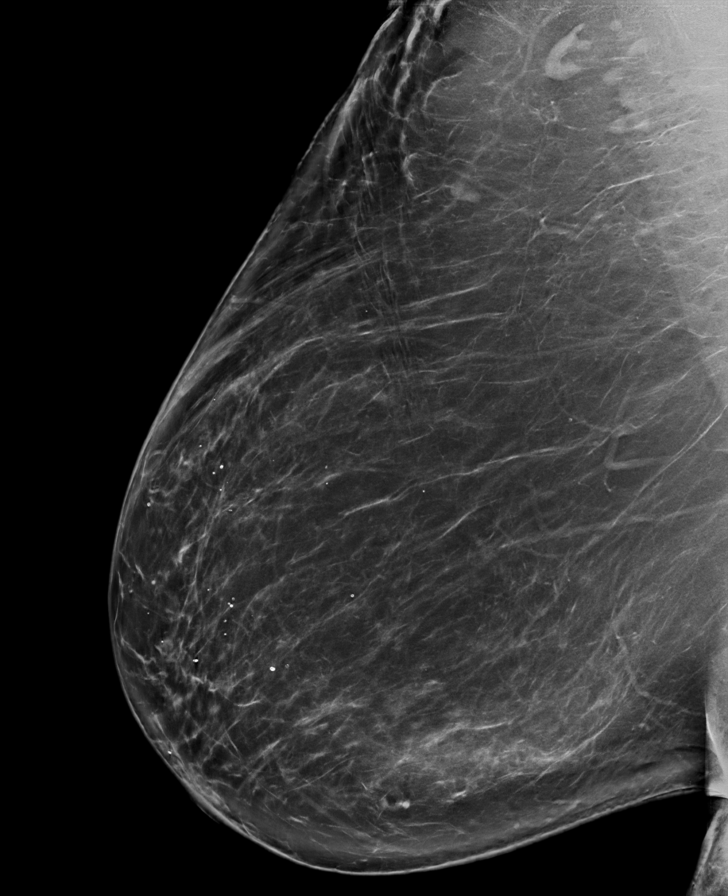

[L CC synth-2D (2 of 2)]
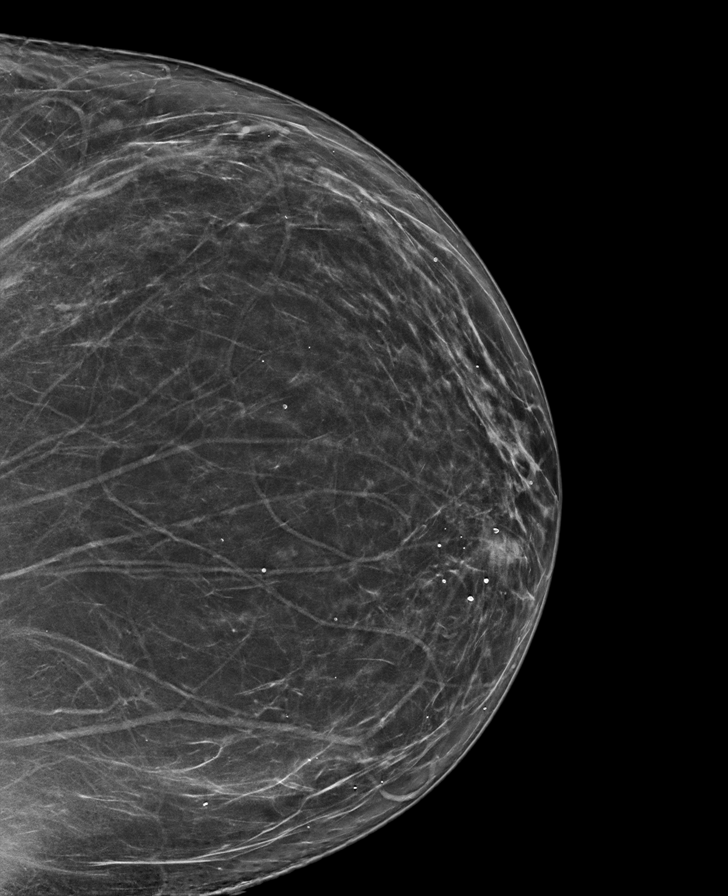

[R CC synth-2D (2 of 2)]
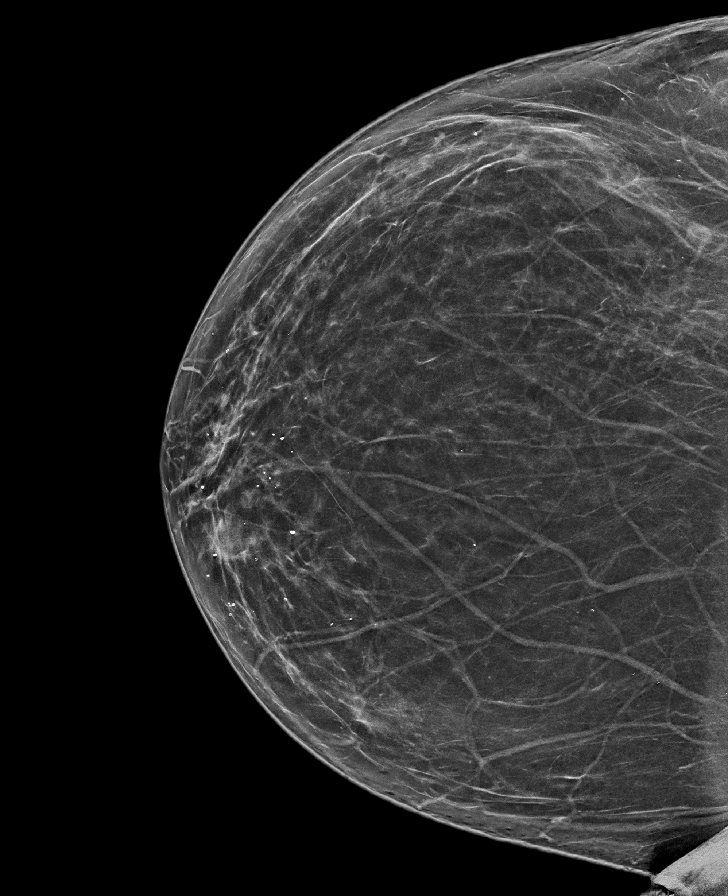

[L CV synth-2D]
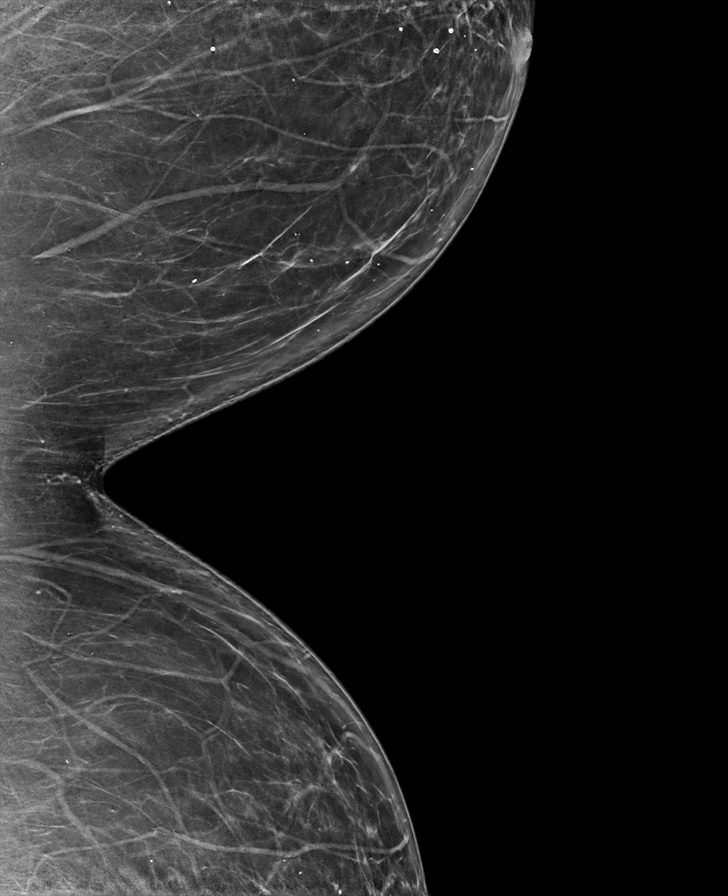

[L CC tomo · tomo slice 39/78.0]
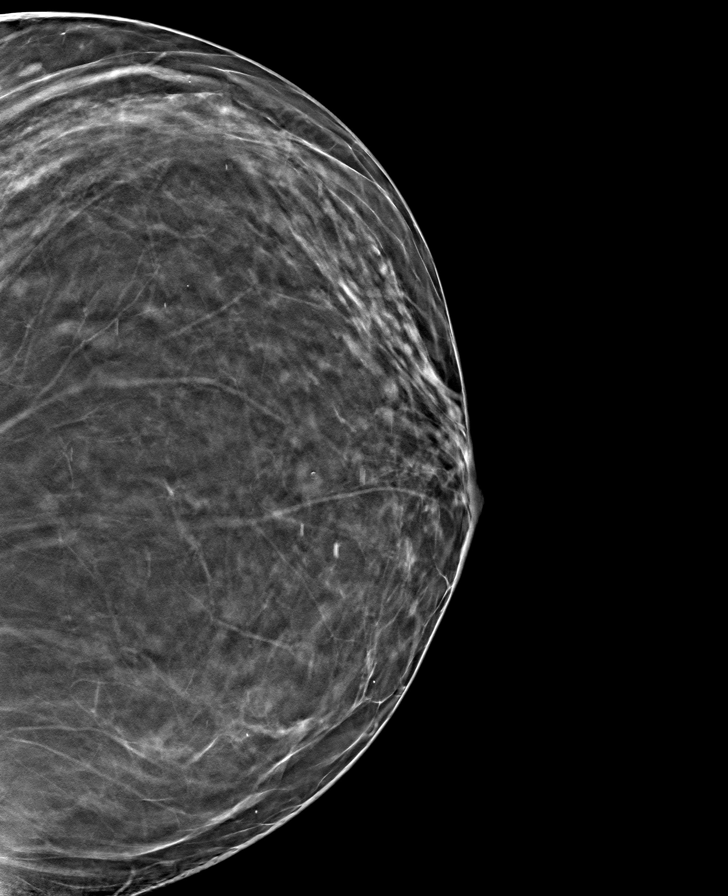

[8 of 40 positions shown; findings below may reference images not displayed]

ACR Breast Density Category b: There are scattered areas of
fibroglandular density.
FINDINGS: There are no findings suspicious for malignancy.
IMPRESSION: No mammographic evidence of malignancy. A result letter of this
screening mammogram will be mailed directly to the patient.

RECOMMENDATION:
Screening mammogram in one year. (Code:51-O-LD2)

BI-RADS CATEGORY  1: Negative.

## 2024-04-06 ENCOUNTER — Telehealth: Payer: Self-pay | Admitting: *Deleted

## 2024-04-19 ENCOUNTER — Other Ambulatory Visit: Payer: Self-pay | Admitting: Family Medicine

## 2024-04-19 DIAGNOSIS — Z1231 Encounter for screening mammogram for malignant neoplasm of breast: Secondary | ICD-10-CM

## 2024-05-04 ENCOUNTER — Ambulatory Visit
Admission: RE | Admit: 2024-05-04 | Discharge: 2024-05-04 | Disposition: A | Discharge status: Home / Self Care | Payer: Self-pay | Source: Ambulatory Visit | Attending: Family Medicine | Admitting: Family Medicine

## 2024-05-04 DIAGNOSIS — Z1231 Encounter for screening mammogram for malignant neoplasm of breast: Secondary | ICD-10-CM | POA: Insufficient documentation
